# Patient Record
Sex: Female | Born: 1975 | Race: White | Hispanic: No | Smoking: Never smoker
Health system: Southern US, Community
[De-identification: ages and names within clinical notes are randomized; demographics above are authoritative.]

## PROBLEM LIST (undated history)

## (undated) DIAGNOSIS — F419 Anxiety disorder, unspecified: Secondary | ICD-10-CM

## (undated) DIAGNOSIS — F329 Major depressive disorder, single episode, unspecified: Secondary | ICD-10-CM

## (undated) DIAGNOSIS — E079 Disorder of thyroid, unspecified: Secondary | ICD-10-CM

## (undated) DIAGNOSIS — F32A Depression, unspecified: Secondary | ICD-10-CM

## (undated) HISTORY — DX: Depression, unspecified: F32.A

## (undated) HISTORY — DX: Major depressive disorder, single episode, unspecified: F32.9

## (undated) HISTORY — DX: Disorder of thyroid, unspecified: E07.9

## (undated) HISTORY — DX: Anxiety disorder, unspecified: F41.9

---

## 2004-01-31 ENCOUNTER — Other Ambulatory Visit: Admission: RE | Admit: 2004-01-31 | Discharge: 2004-01-31 | Payer: Self-pay | Admitting: Obstetrics and Gynecology

## 2005-01-25 ENCOUNTER — Other Ambulatory Visit: Admission: RE | Admit: 2005-01-25 | Discharge: 2005-01-25 | Payer: Self-pay | Admitting: Obstetrics and Gynecology

## 2005-01-26 ENCOUNTER — Other Ambulatory Visit: Admission: RE | Admit: 2005-01-26 | Discharge: 2005-01-26 | Payer: Self-pay | Admitting: Obstetrics and Gynecology

## 2005-09-14 ENCOUNTER — Other Ambulatory Visit: Admission: RE | Admit: 2005-09-14 | Discharge: 2005-09-14 | Payer: Self-pay | Admitting: Obstetrics and Gynecology

## 2006-02-25 ENCOUNTER — Other Ambulatory Visit: Admission: RE | Admit: 2006-02-25 | Discharge: 2006-02-25 | Payer: Self-pay | Admitting: Obstetrics and Gynecology

## 2006-03-16 ENCOUNTER — Encounter: Admission: RE | Admit: 2006-03-16 | Discharge: 2006-03-16 | Payer: Self-pay | Admitting: Obstetrics and Gynecology

## 2007-07-26 ENCOUNTER — Encounter: Admission: RE | Admit: 2007-07-26 | Discharge: 2007-07-26 | Payer: Self-pay | Admitting: Obstetrics and Gynecology

## 2009-04-16 ENCOUNTER — Encounter: Admission: RE | Admit: 2009-04-16 | Discharge: 2009-04-16 | Payer: Self-pay | Admitting: Obstetrics and Gynecology

## 2010-04-30 ENCOUNTER — Encounter: Admission: RE | Admit: 2010-04-30 | Discharge: 2010-04-30 | Payer: Self-pay | Admitting: Obstetrics and Gynecology

## 2011-09-10 ENCOUNTER — Other Ambulatory Visit: Payer: Self-pay | Admitting: Obstetrics and Gynecology

## 2011-09-10 DIAGNOSIS — Z1231 Encounter for screening mammogram for malignant neoplasm of breast: Secondary | ICD-10-CM

## 2011-10-07 ENCOUNTER — Ambulatory Visit
Admission: RE | Admit: 2011-10-07 | Discharge: 2011-10-07 | Disposition: A | Discharge status: Home / Self Care | Payer: BC Managed Care – PPO | Source: Ambulatory Visit | Attending: Obstetrics and Gynecology | Admitting: Obstetrics and Gynecology

## 2011-10-07 DIAGNOSIS — Z1231 Encounter for screening mammogram for malignant neoplasm of breast: Secondary | ICD-10-CM

## 2013-06-07 ENCOUNTER — Ambulatory Visit (INDEPENDENT_AMBULATORY_CARE_PROVIDER_SITE_OTHER): Payer: BC Managed Care – PPO | Admitting: Family Medicine

## 2013-06-07 ENCOUNTER — Encounter: Payer: Self-pay | Admitting: Family Medicine

## 2013-06-07 VITALS — BP 112/82 | Temp 98.3°F | Ht 63.5 in | Wt 122.0 lb

## 2013-06-07 DIAGNOSIS — Z7189 Other specified counseling: Secondary | ICD-10-CM

## 2013-06-07 DIAGNOSIS — Z7689 Persons encountering health services in other specified circumstances: Secondary | ICD-10-CM

## 2013-06-07 DIAGNOSIS — F988 Other specified behavioral and emotional disorders with onset usually occurring in childhood and adolescence: Secondary | ICD-10-CM

## 2013-06-07 DIAGNOSIS — F411 Generalized anxiety disorder: Secondary | ICD-10-CM

## 2013-06-07 DIAGNOSIS — F4321 Adjustment disorder with depressed mood: Secondary | ICD-10-CM

## 2013-06-07 DIAGNOSIS — E039 Hypothyroidism, unspecified: Secondary | ICD-10-CM

## 2013-06-07 DIAGNOSIS — F329 Major depressive disorder, single episode, unspecified: Secondary | ICD-10-CM

## 2013-06-07 LAB — BASIC METABOLIC PANEL
BUN: 13 mg/dL (ref 6–23)
Glucose, Bld: 87 mg/dL (ref 70–99)
Potassium: 3.5 mEq/L (ref 3.5–5.1)
Sodium: 137 mEq/L (ref 135–145)

## 2013-06-07 LAB — HEMOGLOBIN A1C: Hgb A1c MFr Bld: 4.7 % (ref 4.6–6.5)

## 2013-06-07 LAB — LIPID PANEL: Total CHOL/HDL Ratio: 2

## 2013-06-07 NOTE — Patient Instructions (Addendum)
-  We have ordered labs or studies at this visit. It can take up to 1-2 weeks for results and processing. We will contact you with instructions IF your results are abnormal. Normal results will be released to your [facility]. If you have not heard from [place] or can not find your results in [facility] in 2 weeks please contact our office.  -PLEASE SIGN UP FOR MYCHART TODAY   We recommend the following healthy lifestyle measures: - eat a healthy diet consisting of lots of vegetables, fruits, beans, nuts, seeds, healthy meats such as white chicken and fish and whole grains.  - avoid fried foods, fast food, processed foods, sodas, red meet and other fattening foods.  - get a least 150 minutes of aerobic exercise per week.   Schedule appointment with psychiatrist for management of depression, anxiety and ADD. I am happy to refill you medications for the next 2 months if needed before seeing psychiatrist.  Follow up in: as needed

## 2013-06-07 NOTE — Progress Notes (Signed)
Chief Complaint  Patient presents with  . Establish Care  . Depression    husband recently passed away     HPI:  Renee Proctor is here to establish care. She recently lost her husband in marathon race a few months ago. Last PCP and physical: goes to [facility] - had physical yesterday. Has physicals yearly, has chronic high hemoglobin chronically.   Has the following chronic problems and concerns today:  Grief/Depression/Anxiety: -seeing a chaplain/counselor for grief counseling -has a history of depression and on Renee Proctor in the past and many different medications for depression in the past for as long as she can remember -taking Renee Proctor on and off for anxiety -has had worsening depression since death of spouse, difficulty sleeping -put on Renee Proctor a month ago, but stopped recently -she is interested other medications for depression -strong FH psych issues - father hospitalized, brother hospitalized and got ECT  Adult ADD: -reports Dr. Sharl Ma at [facility] diagnosed her with ADD a year and a half ago -put on Renee Proctor 1.5 years ago  Hypothyroid: -Had blood panel in 2013 - borderline hypothyroidism, she had mild hypothyroidism in the past and was on synthroid in the past, but when moved back to [facility] in 2004 this was stopped by PCP at the time.  Has some issues with dry skin and a little hoarseness in voice chronically -denies constipation, weight issues, palpitaitons, vision changes    There are no active problems to display for this patient.   Health Maintenance:  ROS: See pertinent positives and negatives per HPI.  Past Medical History  Diagnosis Date  . Depression   . Anxiety   . Thyroid disease     Family History  Problem Relation Age of Onset  . Heart disease Mother 35  . Hypertension Mother   . Hyperlipidemia Mother   . Mental illness Father   . Mental illness Sister   . Diabetes Maternal Aunt   . Diabetes Maternal Uncle   . Mental illness Paternal  Uncle   . Heart disease Maternal Grandmother   . Diabetes Maternal Grandmother   . Heart disease Maternal Grandfather   . Diabetes Maternal Grandfather   . Mental illness Paternal Grandfather     History   Social History  . Marital Status: Widowed    Spouse Name: N/A    Number of Children: N/A  . Years of Education: N/A   Social History Main Topics  . Smoking status: Never Smoker   . Smokeless tobacco: None  . Alcohol Use: Yes     Comment: occ, 2-3 maybe 1-2 x per month  . Drug Use: No  . Sexually Active: None   Other Topics Concern  . 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  GENERAL: vitals reviewed and listed above, alert, oriented, appears well hydrated and in no acute distress  HEENT: atraumatic, conjunttiva clear, no obvious abnormalities on inspection of external nose and ears  NECK: no obvious masses on inspection  LUNGS: clear to auscultation bilaterally, no wheezes, rales or rhonchi, good air movement  CV: HRRR, no peripheral edema  MS: moves all extremities without noticeable abnormality  PSYCH: pleasant and cooperative, no obvious depression or anxiety  ASSESSMENT AND PLAN:  Discussed the following assessment and plan:  Depression -complex psych hx and psych picture -no SI  -will have her see psychiatry for medication management - numbers provided for her to call and she reports she has plenty of he medications to last to appt with  psych -she will continue with current counselor  Generalized anxiety disorder  Grief reaction -supported and counseled, has good support and counseling  Encounter to establish care - Plan: Lipid Panel, Hemoglobin A1c, Basic metabolic panel, CBC with Differential  ADD (attention deficit disorder)  Hypothyroid - Plan: TSH, T4, Free, T3 -will check thyroid labs given confusion regarding possible hypothyroidism/tx in the past -if borderline she would like to see endocrine to help decide if tx indicated  -We reviewed the PMH, PSH, FH, SH, Meds and Allergies. -We provided refills for any medications we will prescribe as needed. -We addressed current concerns per orders and patient instructions. -We have asked for records for pertinent exams, studies, vaccines and notes from previous providers. -We have advised patient to follow up per instructions below. -offered tdap, she will hold off on this for now   -Patient advised to return or notify a doctor immediately if symptoms worsen or persist or new concerns arise.  Patient Instructions  -We have ordered labs or studies at this visit. It can take up to 1-2 weeks for results and processing. We will contact you with instructions IF your results are abnormal. Normal results will be released to your [facility]. If you have not heard from [place] or can not find your results in [facility] in 2 weeks please contact our office.  -PLEASE SIGN UP FOR MYCHART TODAY   We recommend the following healthy lifestyle measures: - eat a healthy diet consisting of lots of vegetables, fruits, beans, nuts, seeds, healthy meats such as white chicken and fish and whole grains.  - avoid fried foods, fast food, processed foods, sodas, red meet and other fattening foods.  - get a least 150 minutes of aerobic exercise per week.   Schedule appointment with psychiatrist for management of depression, anxiety and ADD. I am happy to refill you medications for the next 2 months if  needed before seeing psychiatrist.  Follow up in: as needed       Kriste Basque R.

## 2013-06-08 LAB — CBC WITH DIFFERENTIAL/PLATELET
Basophils Absolute: 0.1 10*3/uL (ref 0.0–0.1)
Basophils Relative: 0.6 % (ref 0.0–3.0)
Eosinophils Absolute: 0 10*3/uL (ref 0.0–0.7)
Eosinophils Relative: 0.4 % (ref 0.0–5.0)
HCT: 43.1 % (ref 36.0–46.0)
Hemoglobin: 14.6 g/dL (ref 12.0–15.0)
Lymphocytes Relative: 21.9 % (ref 12.0–46.0)
Lymphs Abs: 2 10*3/uL (ref 0.7–4.0)
MCHC: 33.9 g/dL (ref 30.0–36.0)
MCV: 98.4 fl (ref 78.0–100.0)
Monocytes Absolute: 0.3 10*3/uL (ref 0.1–1.0)
Monocytes Relative: 3.3 % (ref 3.0–12.0)
Neutro Abs: 6.8 10*3/uL (ref 1.4–7.7)
Neutrophils Relative %: 73.8 % (ref 43.0–77.0)
Platelets: 208 10*3/uL (ref 150.0–400.0)
RBC: 4.38 Mil/uL (ref 3.87–5.11)
RDW: 13.9 % (ref 11.5–14.6)
WBC: 9.2 10*3/uL (ref 4.5–10.5)

## 2013-06-12 ENCOUNTER — Telehealth: Payer: Self-pay

## 2013-06-12 ENCOUNTER — Encounter: Payer: Self-pay | Admitting: Family Medicine

## 2013-06-12 NOTE — Progress Notes (Signed)
Labs from 2013 and a few OV notes received from prior PCP at [facility]. Thyroid labs normal at the time.

## 2013-06-12 NOTE — Telephone Encounter (Signed)
Per Dr. Selena Batten called and spoke with pt and pt is aware lab work was normal.  Labs faxed over from [facility].

## 2015-06-24 ENCOUNTER — Emergency Department (HOSPITAL_COMMUNITY)
Admission: EM | Admit: 2015-06-24 | Discharge: 2015-06-25 | Disposition: A | Discharge status: Home / Self Care | Payer: BLUE CROSS/BLUE SHIELD | Attending: Emergency Medicine | Admitting: Emergency Medicine

## 2015-06-24 DIAGNOSIS — T50904A Poisoning by unspecified drugs, medicaments and biological substances, undetermined, initial encounter: Secondary | ICD-10-CM

## 2015-06-24 DIAGNOSIS — Y939 Activity, unspecified: Secondary | ICD-10-CM | POA: Diagnosis not present

## 2015-06-24 DIAGNOSIS — F1010 Alcohol abuse, uncomplicated: Secondary | ICD-10-CM | POA: Insufficient documentation

## 2015-06-24 DIAGNOSIS — E079 Disorder of thyroid, unspecified: Secondary | ICD-10-CM | POA: Diagnosis not present

## 2015-06-24 DIAGNOSIS — Z3202 Encounter for pregnancy test, result negative: Secondary | ICD-10-CM | POA: Insufficient documentation

## 2015-06-24 DIAGNOSIS — F1394 Sedative, hypnotic or anxiolytic use, unspecified with sedative, hypnotic or anxiolytic-induced mood disorder: Secondary | ICD-10-CM | POA: Diagnosis not present

## 2015-06-24 DIAGNOSIS — Z880 Allergy status to penicillin: Secondary | ICD-10-CM | POA: Insufficient documentation

## 2015-06-24 DIAGNOSIS — Y999 Unspecified external cause status: Secondary | ICD-10-CM | POA: Insufficient documentation

## 2015-06-24 DIAGNOSIS — Y929 Unspecified place or not applicable: Secondary | ICD-10-CM | POA: Diagnosis not present

## 2015-06-24 DIAGNOSIS — T424X4A Poisoning by benzodiazepines, undetermined, initial encounter: Secondary | ICD-10-CM | POA: Diagnosis present

## 2015-06-24 DIAGNOSIS — Z79899 Other long term (current) drug therapy: Secondary | ICD-10-CM | POA: Insufficient documentation

## 2015-06-24 DIAGNOSIS — Z634 Disappearance and death of family member: Secondary | ICD-10-CM | POA: Insufficient documentation

## 2015-06-24 DIAGNOSIS — F1994 Other psychoactive substance use, unspecified with psychoactive substance-induced mood disorder: Secondary | ICD-10-CM

## 2015-06-24 DIAGNOSIS — F419 Anxiety disorder, unspecified: Secondary | ICD-10-CM | POA: Diagnosis not present

## 2015-06-24 DIAGNOSIS — F329 Major depressive disorder, single episode, unspecified: Secondary | ICD-10-CM | POA: Insufficient documentation

## 2015-06-24 LAB — CBC WITH DIFFERENTIAL/PLATELET
BASOS ABS: 0 10*3/uL (ref 0.0–0.1)
Basophils Relative: 0 % (ref 0–1)
EOS PCT: 1 % (ref 0–5)
Eosinophils Absolute: 0.1 10*3/uL (ref 0.0–0.7)
HCT: 43 % (ref 36.0–46.0)
Hemoglobin: 14.9 g/dL (ref 12.0–15.0)
LYMPHS PCT: 28 % (ref 12–46)
Lymphs Abs: 2 10*3/uL (ref 0.7–4.0)
MCH: 33.2 pg (ref 26.0–34.0)
MCHC: 34.7 g/dL (ref 30.0–36.0)
MCV: 95.8 fL (ref 78.0–100.0)
Monocytes Absolute: 0.3 10*3/uL (ref 0.1–1.0)
Monocytes Relative: 4 % (ref 3–12)
Neutro Abs: 4.7 10*3/uL (ref 1.7–7.7)
Neutrophils Relative %: 67 % (ref 43–77)
PLATELETS: 222 10*3/uL (ref 150–400)
RBC: 4.49 MIL/uL (ref 3.87–5.11)
RDW: 13 % (ref 11.5–15.5)
WBC: 7 10*3/uL (ref 4.0–10.5)

## 2015-06-24 LAB — RAPID URINE DRUG SCREEN, HOSP PERFORMED
Amphetamines: NOT DETECTED
BARBITURATES: NOT DETECTED
BENZODIAZEPINES: POSITIVE — AB
COCAINE: NOT DETECTED
Opiates: NOT DETECTED
TETRAHYDROCANNABINOL: NOT DETECTED

## 2015-06-24 LAB — CBG MONITORING, ED: GLUCOSE-CAPILLARY: 61 mg/dL — AB (ref 65–99)

## 2015-06-24 LAB — POC URINE PREG, ED: PREG TEST UR: NEGATIVE

## 2015-06-24 MED ORDER — SODIUM CHLORIDE 0.9 % IV BOLUS (SEPSIS): 1000.0000 mL | Freq: Once | INTRAVENOUS | Status: DC

## 2015-06-24 NOTE — ED Notes (Signed)
Spoke with Alona BeneJoyce, Poison Control-states to monitor for: -CNS depression -Respiratory depression -routine labs Observe for 6 hours

## 2015-06-24 NOTE — ED Provider Notes (Addendum)
TIME SEEN: 11:20 PM  CHIEF COMPLAINT: Overdose  HPI: Pt is a 39 y.o. female with history of depression, anxiety who presents to the emergency department after an overdose. Reports that she was upset tonight and drinking 2 beers and 2 vodka drinks. Afterward she took around eight 1 mg Xanax tablets between 8 PM and 11 PM because she wanted to get asleep and "stop hurting so bad". She is unable to tell me if this was a suicide attempt. Denies previous suicide attempt. Denies any other coingestions. She is on Wellbutrin as well as amiodarone home.  ROS: See HPI Constitutional: no fever  Eyes: no drainage  ENT: no runny nose   Cardiovascular:  no chest pain  Resp: no SOB  GI: no vomiting GU: no dysuria Integumentary: no rash  Allergy: no hives  Musculoskeletal: no leg swelling  Neurological: no slurred speech ROS otherwise negative  PAST MEDICAL HISTORY/PAST SURGICAL HISTORY:  Past Medical History  Diagnosis Date  . Depression   . Anxiety   . Thyroid disease     MEDICATIONS:  Prior to Admission medications   Medication Sig Start Date End Date Taking? Authorizing Provider  ALPRAZolam Prudy Feeler) 0.5 MG tablet Take 0.5 mg by mouth 2 (two) times daily.    Historical Provider, MD  amphetamine-dextroamphetamine (ADDERALL) 20 MG tablet Take 20 mg by mouth daily.    Historical Provider, MD    ALLERGIES:  Allergies  Allergen Reactions  . Amoxicillin Rash  . Sulfa Antibiotics Rash    SOCIAL HISTORY:  History  Substance Use Topics  . Smoking status: Never Smoker   . Smokeless tobacco: Not on file  . Alcohol Use: Yes     Comment: occ, 2-3 maybe 1-2 x per month    FAMILY HISTORY: Family History  Problem Relation Age of Onset  . Heart disease Mother 3  . Hypertension Mother   . Hyperlipidemia Mother   . Mental illness Father   . Mental illness Sister   . Diabetes Maternal Aunt   . Diabetes Maternal Uncle   . Mental illness Paternal Uncle   . Heart disease Maternal  Grandmother   . Diabetes Maternal Grandmother   . Heart disease Maternal Grandfather   . Diabetes Maternal Grandfather   . Mental illness Paternal Grandfather     EXAM: BP 140/92 mmHg  Pulse 90  Temp(Src) 97.8 F (36.6 C) (Oral)  Resp 13  Ht  (1.6 m)  Wt 123 lb (55.792 kg)  BMI 21.79 kg/m2  SpO2 100% CONSTITUTIONAL: Alert and oriented and responds appropriately to questions.  Tearful, appears intoxicated, slightly slurred speech and smells of alcohol, protecting airway, hemodynamically stable, awake HEAD: Normocephalic EYES: Conjunctivae clear, PERRL ENT: normal nose; no rhinorrhea; slightly dry mucous membranes; pharynx without lesions noted NECK: Supple, no meningismus, no LAD  CARD: Regular and minimally tachycardic; S1 and S2 appreciated; no murmurs, no clicks, no rubs, no gallops RESP: Normal chest excursion without splinting or tachypnea; breath sounds clear and equal bilaterally; no wheezes, no rhonchi, no rales, no hypoxia or respiratory distress, speaking full sentences ABD/GI: Normal bowel sounds; non-distended; soft, non-tender, no rebound, no guarding, no peritoneal signs BACK:  The back appears normal and is non-tender to palpation, there is no CVA tenderness EXT: Normal ROM in all joints; non-tender to palpation; no edema; normal capillary refill; no cyanosis, no calf tenderness or swelling    SKIN: Normal color for age and race; warm NEURO: Moves all extremities equally, sensation to light touch intact diffusely,  cranial nerves II through XII intact PSYCH: Tearful. Patient will not answer me if she is having suicidal thoughts. Denies HI, hallucinations.  MEDICAL DECISION MAKING: Patient here after intentional overdose. She states she took the Xanax over the course of 3 hours starting at 8 PM tonight. Also has drank alcohol. No other coingestion.  We'll continue to monitor patient. Will obtain labs, urine and consult TTS. EKG shows no interval changes,  arrhythmia.  ED PROGRESS: 12:45 AM  Pt still awake, alert. Labs unremarkable other than ethanol level of 121, urine drug screen is positive for benzodiazepine's. She has been evaluated by TTS and they recommend reevaluation by psychiatry in the morning. She is here voluntarily at this time.   5:00 AM  Pt has been monitored for 6 hours after ingestion and is hemodynamically stable. Awaiting psychiatric reevaluation this morning.   EKG Interpretation  Date/Time:  Tuesday June 24 2015 23:17:22 EDT Ventricular Rate:  91 PR Interval:  129 QRS Duration: 78 QT Interval:  369 QTC Calculation: 454 R Axis:   74 Text Interpretation:  Sinus rhythm Borderline low voltage, extremity leads No significant change since last tracing Confirmed by WARD,  DO, KRISTEN [phone]) on 06/24/2015 11:19:50 PM        Kristen N Ward, DO 06/25/15 0443  6:30 AM  Pt able to ambulate without difficulty. Tolerating fluids. Awaiting psychiatric disposition. Here voluntarily.  Layla MawKristen N Ward, DO 06/25/15 [phone]

## 2015-06-24 NOTE — ED Notes (Signed)
TTS at bedside. 

## 2015-06-24 NOTE — ED Notes (Signed)
Patient arrives by EMS with overdose xanax.  Per EMS, patient was at the bar, was upset, hx depression, recent death of spouse, per [employer], they were called out by friends because the patient would not answer her phone or door.  Friends forced entry and found patient "groggy" in her bedroom.  Patient took unknown amount of xanax 1 mg tabs-had 2 bottles of xanax 1 mg-90 tabs per each bottle, bottles filled 05/12/2015, and June 24th.  One bottle is empty, another bottle has 121 tablets, 1 mg and 9 1/2 tabs.  Patient also drank 4-5 mixed drinks.

## 2015-06-24 NOTE — ED Notes (Signed)
Bed: RESB Expected date:  Expected time:  Means of arrival:  Comments: EMS overdose xanax

## 2015-06-24 NOTE — BH Assessment (Signed)
Reviewed ED notes prior to initiating assessment. Per notes pt took unknown amount of Xanax with 4-5 alcoholic drinks. Pt has hx of depression and her spouse recently passed away. Assessment to commence shortly if pt is able to participate.    Clista BernhardtNancy Hebah Bogosian, [facility] Triage Specialist 06/24/2015 11:26 PM

## 2015-06-25 DIAGNOSIS — F1010 Alcohol abuse, uncomplicated: Secondary | ICD-10-CM | POA: Diagnosis present

## 2015-06-25 DIAGNOSIS — Z634 Disappearance and death of family member: Secondary | ICD-10-CM

## 2015-06-25 DIAGNOSIS — F1994 Other psychoactive substance use, unspecified with psychoactive substance-induced mood disorder: Secondary | ICD-10-CM | POA: Diagnosis present

## 2015-06-25 LAB — COMPREHENSIVE METABOLIC PANEL
ALT: 24 U/L (ref 14–54)
AST: 36 U/L (ref 15–41)
Albumin: 3.9 g/dL (ref 3.5–5.0)
Alkaline Phosphatase: 37 U/L — ABNORMAL LOW (ref 38–126)
Anion gap: 9 (ref 5–15)
BILIRUBIN TOTAL: 0.8 mg/dL (ref 0.3–1.2)
BUN: 10 mg/dL (ref 6–20)
CALCIUM: 9.1 mg/dL (ref 8.9–10.3)
CO2: 23 mmol/L (ref 22–32)
Chloride: 107 mmol/L (ref 101–111)
Creatinine, Ser: 0.79 mg/dL (ref 0.44–1.00)
GFR calc Af Amer: >60 mL/min (ref >60)
GLUCOSE: 87 mg/dL (ref 65–99)
Potassium: 4 mmol/L (ref 3.5–5.1)
SODIUM: 139 mmol/L (ref 135–145)
TOTAL PROTEIN: 7.4 g/dL (ref 6.5–8.1)

## 2015-06-25 LAB — URINALYSIS, ROUTINE W REFLEX MICROSCOPIC
BILIRUBIN URINE: NEGATIVE
GLUCOSE, UA: NEGATIVE mg/dL
KETONES UR: NEGATIVE mg/dL
Nitrite: NEGATIVE
Protein, ur: NEGATIVE mg/dL
Specific Gravity, Urine: 1.003 — ABNORMAL LOW (ref 1.005–1.030)
Urobilinogen, UA: 0.2 mg/dL (ref 0.0–1.0)
pH: 6 (ref 5.0–8.0)

## 2015-06-25 LAB — ACETAMINOPHEN LEVEL

## 2015-06-25 LAB — URINE MICROSCOPIC-ADD ON

## 2015-06-25 LAB — SALICYLATE LEVEL: Salicylate Lvl: <4 mg/dL (ref 2.8–30.0)

## 2015-06-25 LAB — ETHANOL: ALCOHOL ETHYL (B): 121 mg/dL — AB (ref <5)

## 2015-06-25 MED ORDER — FLUTICASONE PROPIONATE 50 MCG/ACT NA SUSP
1.0000 | Freq: Every day | NASAL | Status: DC
  Administered 2015-06-25: 1 via NASAL
  Filled 2015-06-25: qty 16

## 2015-06-25 MED ORDER — BUPROPION HCL ER (XL) 150 MG PO TB24
150.0000 mg | ORAL_TABLET | Freq: Two times a day (BID) | ORAL | Status: DC
  Administered 2015-06-25: 150 mg via ORAL
  Filled 2015-06-25 (×2): qty 1

## 2015-06-25 MED ORDER — GLYCOPYRROLATE 1 MG PO TABS
2.0000 mg | ORAL_TABLET | Freq: Three times a day (TID) | ORAL | Status: DC
Start: 2015-06-25 — End: 2015-06-25
  Administered 2015-06-25: 2 mg via ORAL
  Filled 2015-06-25 (×3): qty 2

## 2015-06-25 MED ORDER — LORATADINE 10 MG PO TABS
10.0000 mg | ORAL_TABLET | Freq: Every day | ORAL | Status: DC
  Administered 2015-06-25: 10 mg via ORAL

## 2015-06-25 MED ORDER — PANTOPRAZOLE SODIUM 40 MG PO TBEC
40.0000 mg | DELAYED_RELEASE_TABLET | Freq: Every day | ORAL | Status: DC
  Administered 2015-06-25: 40 mg via ORAL
  Filled 2015-06-25: qty 1

## 2015-06-25 MED ORDER — LEVONORGEST-ETH ESTRAD 91-DAY 0.15-0.03 MG PO TABS: 1.0000 | ORAL_TABLET | Freq: Every day | ORAL | Status: DC

## 2015-06-25 NOTE — ED Notes (Signed)
I arrived to find her resting comfortably in the presence of a sitter.  Her skin is normal, warm and dry and she is breathing normally and in no distress.  I have given report to BerkeyJacqueline, [place] [facility] and [place]. Is accompanied there now by our sitter and security without incident. She ambulates without difficulty.

## 2015-06-25 NOTE — ED Notes (Signed)
Pt ambulated to bathroom with steady gait. She apologized for her behavior earlier, stating" I'm very sorry, that's not me, I'm really a nice person" She denies SI at this time, sts "I didn't even had that much, just a few drinks, it was bad idea to mix alcohol and medications."

## 2015-06-25 NOTE — ED Notes (Signed)
IVC papers served. 

## 2015-06-25 NOTE — ED Notes (Signed)
Pt again attempting to leave her room without any clothes on. Pt redirected without much success. Pt very tearful, worried about her dog, stating we "might as well kill me". Attempted to sooth pt. Boundaries set. Sitter remains at bedside.

## 2015-06-25 NOTE — Discharge Instructions (Addendum)
For your ongoing behavioral health needs you are advised to follow up with an outpatient therapist.  Contact the following practices at your earliest opportunity to schedule an intake appointment:        [facility]      [address]      [place] [address]      [phone]       [facility]      [address]      [place], [place] [address]      [phone]       [facility]      [phone]      [place], [place] [address]      [phone]

## 2015-06-25 NOTE — ED Notes (Signed)
Called into room by sitter, pt had removed her gown and was attempting to walk out to the nurses station to "call her dog". Assisted pt back into bed by sitter and RN, cardiac monitor placed back on pt, and policy which states she can make calls starting at 9 in the morning.

## 2015-06-25 NOTE — BHH Suicide Risk Assessment (Signed)
Suicide Risk Assessment  Discharge Assessment   [facility]   Demographic Factors:  Female and Caucasian, lives alone  Total Time spent with patient: 45 minutes   Musculoskeletal: Strength & Muscle Tone: within normal limits Gait & Station: normal Patient leans: N/A  Psychiatric Specialty Exam: Physical Exam  Review of Systems  Constitutional: Negative.   HENT: Negative.   Eyes: Negative.   Respiratory: Negative.   Cardiovascular: Negative.   Gastrointestinal: Negative.   Genitourinary: Negative.   Musculoskeletal: Negative.   Skin: Negative.   Neurological: Negative.   Endo/Heme/Allergies: Negative.   Psychiatric/Behavioral: Positive for depression and substance abuse. The patient is nervous/anxious.     Blood pressure 130/85, pulse 77, temperature 98.6 F (37 C), temperature source Oral, resp. rate 19, height 5\' 3"  (1.6 m), weight 55.792 kg (123 lb), SpO2 100 %.Body mass index is 21.79 kg/(m^2).  General Appearance: Casual  Eye Contact::  Good  Speech:  Normal Rate  Volume:  Normal  Mood:  Depressed mild  Affect:  Congruent  Thought Process:  Coherent  Orientation:  Full (Time, Place, and Person)  Thought Content:  WDL  Suicidal Thoughts:  No  Homicidal Thoughts:  No  Memory:  Immediate;   Good Recent;   Fair Remote;   Good  Judgement:  Fair  Insight:  Good  Psychomotor Activity:  Normal  Concentration:  Good  Recall:  Good  Fund of Knowledge:Good  Language: Good  Akathisia:  No  Handed:  Right  AIMS (if indicated):     Assets:  Communication Skills Desire for Improvement Financial Resources/Insurance Housing Leisure Time Physical Health Resilience Social Support Talents/Skills Transportation Vocational/Educational  ADL's:  Intact  Cognition: WNL  Sleep:         Has this patient used any form of tobacco in the last 30 days? (Cigarettes, Smokeless Tobacco, Cigars, and/or Pipes) No  Mental Status Per Nursing  Assessment::   On Admission:   Alcohol abuse  Current Mental Status by Physician: NA  Loss Factors: NA  Historical Factors: NA  Risk Reduction Factors:   Sense of responsibility to family, Employed, Positive social support and Positive coping skills or problem solving skills  Continued Clinical Symptoms:  Depression, mild  Cognitive Features That Contribute To Risk:  None    Suicide Risk:  Minimal: No identifiable suicidal ideation.  Patients presenting with no risk factors but with morbid ruminations; may be classified as minimal risk based on the severity of the depressive symptoms  Principal Problem: Substance induced mood disorder Discharge Diagnoses:  Patient Active Problem List   Diagnosis Date Noted  . Alcohol abuse [F10.10] 06/25/2015    Priority: High  . Substance induced mood disorder [F19.94] 06/25/2015    Priority: High  . Bereavement [Z63.4] 06/25/2015    Priority: High      Plan Of Care/Follow-up recommendations:  Activity:  as tolerated Diet:  heart healthy diet  Is patient on multiple antipsychotic therapies at discharge:  No   Has Patient had three or more failed trials of antipsychotic monotherapy by history:  No  Recommended Plan for Multiple Antipsychotic Therapies: NA    Renee Proctor, PMH-NP 06/25/2015, 1:18 PM

## 2015-06-25 NOTE — ED Notes (Signed)
Per pt's request this RN called pt's mom to inform her pt is in the ED and wants mom to take care of her dog. [place] [facility] [place]. Pt updated on phone conversation with her mom.

## 2015-06-25 NOTE — ED Notes (Signed)
Due to patient incessant requests, Mother was contacted to make sure that she had the dog, that the dog was okay and that she had the dog's medications. Patient also requested that mother contact her work, number was given to mother.

## 2015-06-25 NOTE — ED Notes (Signed)
Pt is trying to get out of bed, pulling off monitor and cursing at staff. Pt stood up with very unsteady gait and had to be helped back to bed. She is very uncooperative, she took her gown off and is crying "You are fucking bitch, you need to let me go to my dog. My dog is all I have and if he dies I promise I will blow my brains out". Pt assured we will check on her dog.

## 2015-06-25 NOTE — BH Assessment (Addendum)
Tele Assessment Note BAL pending at time of assessment   Renee Proctor is an 39 y.o. 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 Police were called to pt's home at the request of concerned friends, who reported pt had become upset at a bar, and then would not answer her phone or door. Police had to force entry and found pt "groggy" in her room. Pt gave varying answers about how much alcohol and xanax she had taken tonight, but significant overdose was suspected and poison control recommended 6 hours of monitoring. Police report pt has at least 10 prescription bottles at there home, and brought two Xanax bottles with them. Pt has prescriptions for 1mg  Xanax up to three times per day as needed, and had two bottles that had 90 pills when filled. Rn counted 121 pills remaining from prescriptions from May and June.   At time of assessment pt was upset. She reported she needed to get her dog from a friend's house and wanted her parents to take care of her dog. She reported her friend took the dog to his house, and she does not feel he cares for his own pets, so she worried about her dog. She stated, "If my dog dies I will blow my brains out." Pt was oriented to person, and place. She reported the date was July 25 or 26, 2016. She reported, "I was drinking at a bar, as my friends do, and became obnoxious, morose and sullen, and my friends called the cops." She reported that she did not feel she was acting any differently than her friends do. She reports she hangs out at predominantly gay bars and whichever girl is being dramatic, tends to "freak them (her friend) out." Pt had slurred but coherent speech, depressed and anxious mood with appropriate affect. She reports she lost her husband unexpectedly 2 years ago, when he died of heart failure while running a marathon. Pt reports she has flashbacks of seeing him in his casket, and feels very guilty, because she had started taking adderall and mentioned to him how  well it was working for her, and then he got his own prescription. She wonders if this contributed to his death. Pt reports she has worked at the same place for 13 years and is fearful that she will be fired if she does not show up for work, and they find out why she was in ED. Pt denies AVH, HI, and self harm. She reports she drinks weekends 3-4 drinks, reported 4-5 to other ED staff. She denies use of drugs, reports she takes prescribed medications as prescribed except she takes half of the prescribed adderall as she reports it keeps her up at night. Pt told this Clinical research associate she took half a xanax tonight, however, told EDP she took 8, 1 mg Xanax so she could "stop hurting" and would not confirm if this was a suicide attempt. Pt denies past or present suicide attempts to this Clinical research associate. She reports she has SI at times but "I am too much of a coward to do it." She reports she has to stay alive for her family and her dog. Pt denies current suicidal planning but reports in the past she has thought about overdosing, shooting herself, or gassing herself in her garage. She reports she is tired of "this scene, this atmosphere," but denies suicidal intent.   Pt reports she has struggled with depression since age 21, but notes her current sx are the worst they have ever been.  She reports loss of pleasure, loss of motivation, crying spells, irritability, feeling sad about her friends, and just things in the world, SI and at times SI with planning. She denies current intent to harm herself or others. Denies hx of mania or hypomania.   Pt reports she has struggled with anxiety much of her life and began to have panic attacks after she lost her husband. She reports before he died she would worry when he traveled, and imagine his plane had crashed if she did not hear from him right away. Pt denies hx of abuse or neglect.   Pt reports family hx is positive for alzheimer's (dad) and reports her dad had "a nervous breakdown once." No  suicidal or SA hx noted.   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 Depression   . Anxiety   . Thyroid disease     No past surgical history on file.  Family History:  Family History  Problem Relation Age of Onset  . Heart disease Mother 68  . Hypertension Mother   . Hyperlipidemia Mother   . Mental illness Father   . Mental illness Sister   . Diabetes Maternal Aunt   . Diabetes Maternal Uncle   . Mental illness Paternal Uncle   . Heart disease Maternal Grandmother   . Diabetes Maternal Grandmother   . Heart disease Maternal Grandfather   . Diabetes Maternal Grandfather   . Mental illness Paternal Grandfather     Social History:  reports that she has never smoked. She does not have any smokeless tobacco history on file. She reports that she drinks alcohol. She reports that she does not use illicit drugs.  Additional Social History:  Alcohol / Drug Use Pain Medications: See PTA Prescriptions: SEE PTA Over the Counter: See PTA History of alcohol / drug use?: Yes Longest period of sobriety (when/how long): days to weeks, no hx of seizures reported  Negative Consequences of Use:  (denies) Withdrawal Symptoms:  (denies) Substance #1 Name of Substance 1: etoh  1 - Age of First Use: 17 1 - Amount (size/oz): 3-4 but reproted 4-5 drinks to RN 1 - Frequency: on weekends 1 - Duration: years 1 - Last Use / Amount: 06-24-15 3-4 drinks  CIWA: CIWA-Ar BP: 145/94 mmHg Pulse Rate: 103 COWS:    PATIENT STRENGTHS: (choose at least two) Ability for insight Average or above average intelligence Communication skills Work skills  Allergies:  Allergies  Allergen Reactions  . Amoxicillin Rash  . Sulfa  Antibiotics Rash    Home Medications:  (Not in a hospital admission)  OB/GYN Status:  No LMP recorded.  General Assessment Data Location of Assessment: [facility] TTS Assessment: In system Is this a Tele or Face-to-Face Assessment?: Face-to-Face Is this an Initial Assessment or a Re-assessment for this encounter?: Initial Assessment Marital status: Widowed Is patient pregnant?: No Pregnancy Status: No Living Arrangements: Alone (with dog Nanine Means) Can pt return to current living arrangement?: Yes Admission Status:  (IVC is pending ) Is patient capable of signing voluntary admission?: No Referral Source: Self/Family/Friend Insurance type: (SP)     Crisis Care Plan Living Arrangements: Alone (with dog Nanine Means) Name of Psychiatrist: Dr. Lendon Colonel Name of Therapist: Constance Holster  Education Status  Is patient currently in school?: No Current Grade: NA Highest grade of school patient has completed: Master's Name of school: NA Contact person: NA  Risk to self with the past 6 months Suicidal Ideation: No-Not Currently/Within Last 6 Months Has patient been a risk to self within the past 6 months prior to admission? : Yes Suicidal Intent:  (pt denies suicidal intent but took possible Xanax overdose) Has patient had any suicidal intent within the past 6 months prior to admission? :  (denies, "I have to be here for my family and my dog") Is patient at risk for suicide?: Yes Suicidal Plan?: Yes-Currently Present Has patient had any suicidal plan within the past 6 months prior to admission? : Yes Specify Current Suicidal Plan: pt reports she has thought of overdosing, gasing herself in the garage, shooting herself Access to Means: Yes Specify Access to Suicidal Means: medications, car  What has been your use of drugs/alcohol within the last 12 months?: Pt reports she drinks on weekends 3-4, or 4-5 drinks Previous Attempts/Gestures: No How many times?: 0 Other Self Harm Risks:  none Triggers for Past Attempts: None known Intentional Self Injurious Behavior: None Family Suicide History: No Recent stressful life event(s): Loss (Comment) (loss of SO suddenly 2 years ago ) Persecutory voices/beliefs?: No Depression: Yes Depression Symptoms: Despondent, Insomnia, Tearfulness, Guilt, Loss of interest in usual pleasures, Feeling worthless/self pity, Feeling angry/irritable Substance abuse history and/or treatment for substance abuse?: No Suicide prevention information given to non-admitted patients: Not applicable  Risk to Others within the past 6 months Homicidal Ideation: No Does patient have any lifetime risk of violence toward others beyond the six months prior to admission? : No Thoughts of Harm to Others: No Current Homicidal Intent: No Current Homicidal Plan: No Access to Homicidal Means: No Identified Victim: none History of harm to others?: No Assessment of Violence: None Noted Violent Behavior Description: none Does patient have access to weapons?: No Criminal Charges Pending?: No Does patient have a court date: No Is patient on probation?: No  Psychosis Hallucinations: None noted Delusions: None noted  Mental Status Report Appearance/Hygiene: Unremarkable Eye Contact: Good Motor Activity: Unremarkable Speech: Slurred, Logical/coherent Level of Consciousness: Alert Mood: Depressed, Anxious Affect: Appropriate to circumstance Anxiety Level: Severe Thought Processes: Coherent, Relevant Judgement: Impaired Orientation: Person, Place, Situation (reports date is 6925 or 26 of July ) Obsessive Compulsive Thoughts/Behaviors: None  Cognitive Functioning Concentration: Decreased Memory: Recent Intact, Remote Intact IQ: Average Insight: Fair Impulse Control: Poor Appetite: Good Weight Loss: 0 Weight Gain: 0 Sleep: Decreased Total Hours of Sleep: 4 Vegetative Symptoms: None  ADLScreening [facility] Assessment Services) Patient's cognitive ability  adequate to safely complete daily activities?: Yes Patient able to express need for assistance with ADLs?: Yes Independently performs ADLs?: Yes (appropriate for developmental age)  Prior Inpatient Therapy Prior Inpatient Therapy: No Prior Therapy Dates: NA Prior Therapy Facilty/Provider(s): NA Reason for Treatment: NA  Prior Outpatient Therapy Prior Outpatient Therapy: Yes Prior Therapy Dates: current for past three years Prior Therapy Facilty/Provider(s): Dr. Lendon ColonelHawks, and Margret Sallinger Reason for Treatment: medication management, therapy  Does patient have an ACCT team?: No Does patient have Intensive In-House Services?  : No Does patient have [facility] services? : No Does patient have P4CC services?: No  ADL Screening (condition at time of admission) Patient's cognitive ability adequate to safely complete daily activities?: Yes Patient able to express need for assistance with ADLs?: Yes Independently performs ADLs?: Yes (appropriate for developmental age)  Abuse/Neglect Assessment (Assessment to be complete while patient is alone) Physical Abuse: Denies Verbal Abuse: Denies Sexual Abuse: Denies Exploitation of patient/patient's resources: Denies Self-Neglect: Denies Values / Beliefs Cultural Requests During Hospitalization: None Spiritual Requests During Hospitalization: None   Advance Directives (For Healthcare) Does patient have an advance directive?: No Would patient like information on creating an advanced directive?: No - patient declined information    Additional Information 1:1 In Past 12 Months?: No CIRT Risk: No Elopement Risk: No Does patient have medical clearance?: No (Per Poison Control observe for 6 hours )     Disposition:  Per Donell Sievert, PA pt to have an AM psych evaluation to uphold or rescind IVC. Informed Dr. Elesa Massed of recommendations and she is in agreement. Informed pt and and RN. Pt asked if she could leave with her mother if her  mother came to ED. Informed pt she would need to be seen by psychiatry prior to being discharged. She then became upset and said she would get fired tomorrow and lose her insurance, so the hospital should think about discharging her now as she would not have insurance to pay for service.      Clista Bernhardt, [facility] Triage Specialist 06/25/2015 12:26 AM  Disposition Initial Assessment Completed for this Encounter: Yes  Kaivon Livesey M 06/25/2015 12:24 AM

## 2015-06-25 NOTE — Consult Note (Signed)
[facility] Face-to-Face Psychiatry Consult   Reason for Consult:  Questionable overdose Referring Physician:  EDP Patient Identification: Renee Proctor MRN:  [id number] Principal Diagnosis: Substance induced mood disorder Diagnosis:   Patient Active Problem List   Diagnosis Date Noted  . Alcohol abuse [F10.10] 06/25/2015    Priority: High  . Substance induced mood disorder [F19.94] 06/25/2015    Priority: High  . Bereavement [Z63.4] 06/25/2015    Priority: High    Total Time spent with patient: 45 minutes  Subjective:   Renee Proctor is a 39 y.o. female patient does not warrant admission.  HPI:  The patient presented to the ED after her friends saw an empty medication bottle and thought she had overdosed.  She has been prescribed Xanax for anxiety and took two prior to going to a bar last night and drank.  When she returned home, she took another Xanax to go to sleep.  When her friends came by and saw an empty bottle with Renee Proctor being groggy.  Renee Proctor reports having her first black out and cannot remember anything from taking the Xanax and waking up in the hospital, which scares her greatly.  She denies suicidal ideations, past attempts, and hospitalizations.  Renee Proctor adamantly denies overdosing and is clear and coherent today.  If she had overdosed, she would at least be sleepy or groggy.  Renee Proctor states she had an empty bottle because she had combined her Xanax from last month (does not use them all) into another bottle.  She wants to return home to her dogs who give her comfort and has called her mother who is supportive.  Renee Proctor has been having some depression related to her husband's sudden death in a marathon two years ago.  She has been to counseling and is willing to return.  Renee Proctor has joined a gym and was suppose to go this morning prior to work.  She has a steady job and worked there for a few years.  Renee Proctor plans to wean off of her Xanax which was started after her husband passed.  She  denies suicidal ideations, past attempts, and psychiatric hospitalizations.  Denies homicidal ideations and violence, hallucinations.  Stable for discharge. HPI Elements:   Location:  generalized. Quality:  acute. Severity:  mild. Timing:  intermittent. Duration:  brief. Context:  stressors.  Past Medical History:  Past Medical History  Diagnosis Date  . Depression   . Anxiety   . Thyroid disease    No past surgical history on file. Family History:  Family History  Problem Relation Age of Onset  . Heart disease Mother 44  . Hypertension Mother   . Hyperlipidemia Mother   . Mental illness Father   . Mental illness Sister   . Diabetes Maternal Aunt   . Diabetes Maternal Uncle   . Mental illness Paternal Uncle   . Heart disease Maternal Grandmother   . Diabetes Maternal Grandmother   . Heart disease Maternal Grandfather   . Diabetes Maternal Grandfather   . Mental illness Paternal Grandfather    Social History:  History  Alcohol Use  . Yes    Comment: occ, 2-3 maybe 1-2 x per month     History  Drug Use No    History   Social History  . Marital Status: Widowed    Spouse Name: N/A  . Number of Children: N/A  . Years of Education: N/A   Social History Main Topics  . Smoking status: Never Smoker   . Smokeless  tobacco: Not on file  . Alcohol Use: Yes     Comment: occ, 2-3 maybe 1-2 x per month  . Drug Use: No  . Sexual Activity: Not on file   Other Topics Concern  . 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      Home Situation: lives alone      Spiritual Beliefs: atheist      Lifestyle: no regular exercise; diet is poor            Additional Social History:    Pain Medications: See PTA Prescriptions: SEE PTA Over the Counter: See PTA History of alcohol / drug use?: Yes Longest period of sobriety (when/how long): days to weeks, no hx of seizures reported  Negative Consequences of Use:   (denies) Withdrawal Symptoms:  (denies) Name of Substance 1: etoh  1 - Age of First Use: 17 1 - Amount (size/oz): 3-4 but reproted 4-5 drinks to RN 1 - Frequency: on weekends 1 - Duration: years 1 - Last Use / Amount: 06-24-15 3-4 drinks                   Allergies:   Allergies  Allergen Reactions  . Amoxicillin Rash  . Sulfa Antibiotics Rash    Labs:  Results for orders placed or performed during the hospital encounter of 06/24/15 (from the past 48 hour(s))  POC CBG, ED     Status: Abnormal   Collection Time: 06/24/15 11:23 PM  Result Value Ref Range   Glucose-Capillary 61 (L) 65 - 99 mg/dL  Urine rapid drug screen (hosp performed)not at [facility]     Status: Abnormal   Collection Time: 06/24/15 11:25 PM  Result Value Ref Range   Opiates NONE DETECTED NONE DETECTED   Cocaine NONE DETECTED NONE DETECTED   Benzodiazepines POSITIVE (A) NONE DETECTED   Amphetamines NONE DETECTED NONE DETECTED   Tetrahydrocannabinol NONE DETECTED NONE DETECTED   Barbiturates NONE DETECTED NONE DETECTED    Comment:        DRUG SCREEN FOR MEDICAL PURPOSES ONLY.  IF CONFIRMATION IS NEEDED FOR ANY PURPOSE, NOTIFY LAB WITHIN 5 DAYS.        LOWEST DETECTABLE LIMITS FOR URINE DRUG SCREEN Drug Class       Cutoff (ng/mL) Amphetamine      1000 Barbiturate      200 Benzodiazepine   937 Tricyclics       342 Opiates          300 Cocaine          300 THC              50   Urinalysis, Routine w reflex microscopic (not at [facility])     Status: Abnormal   Collection Time: 06/24/15 11:25 PM  Result Value Ref Range   Color, Urine YELLOW YELLOW   APPearance CLOUDY (A) CLEAR   Specific Gravity, Urine 1.003 (L) 1.005 - 1.030   pH 6.0 5.0 - 8.0   Glucose, UA NEGATIVE NEGATIVE mg/dL   Hgb urine dipstick TRACE (A) NEGATIVE   Bilirubin Urine NEGATIVE NEGATIVE   Ketones, ur NEGATIVE NEGATIVE mg/dL   Protein, ur NEGATIVE NEGATIVE mg/dL   Urobilinogen, UA 0.2 0.0 - 1.0 mg/dL   Nitrite NEGATIVE NEGATIVE    Leukocytes, UA SMALL (A) NEGATIVE  Urine microscopic-add on     Status: Abnormal   Collection Time: 06/24/15 11:25 PM  Result Value Ref Range  WBC, UA 3-6 <3 WBC/hpf   RBC / HPF 0-2 <3 RBC/hpf   Bacteria, UA MANY (A) RARE  CBC WITH DIFFERENTIAL     Status: None   Collection Time: 06/24/15 11:43 PM  Result Value Ref Range   WBC 7.0 4.0 - 10.5 K/uL   RBC 4.49 3.87 - 5.11 MIL/uL   Hemoglobin 14.9 12.0 - 15.0 g/dL   HCT 43.0 36.0 - 46.0 %   MCV 95.8 78.0 - 100.0 fL   MCH 33.2 26.0 - 34.0 pg   MCHC 34.7 30.0 - 36.0 g/dL   RDW 13.0 11.5 - 15.5 %   Platelets 222 150 - 400 K/uL   Neutrophils Relative % 67 43 - 77 %   Neutro Abs 4.7 1.7 - 7.7 K/uL   Lymphocytes Relative 28 12 - 46 %   Lymphs Abs 2.0 0.7 - 4.0 K/uL   Monocytes Relative 4 3 - 12 %   Monocytes Absolute 0.3 0.1 - 1.0 K/uL   Eosinophils Relative 1 0 - 5 %   Eosinophils Absolute 0.1 0.0 - 0.7 K/uL   Basophils Relative 0 0 - 1 %   Basophils Absolute 0.0 0.0 - 0.1 K/uL  Comprehensive metabolic panel     Status: Abnormal   Collection Time: 06/24/15 11:43 PM  Result Value Ref Range   Sodium 139 135 - 145 mmol/L   Potassium 4.0 3.5 - 5.1 mmol/L   Chloride 107 101 - 111 mmol/L   CO2 23 22 - 32 mmol/L   Glucose, Bld 87 65 - 99 mg/dL   BUN 10 6 - 20 mg/dL   Creatinine, Ser 0.79 0.44 - 1.00 mg/dL   Calcium 9.1 8.9 - 10.3 mg/dL   Total Protein 7.4 6.5 - 8.1 g/dL   Albumin 3.9 3.5 - 5.0 g/dL   AST 36 15 - 41 U/L   ALT 24 14 - 54 U/L   Alkaline Phosphatase 37 (L) 38 - 126 U/L   Total Bilirubin 0.8 0.3 - 1.2 mg/dL   GFR calc non Af Amer >60 >60 mL/min   GFR calc Af Amer >60 >60 mL/min    Comment: (NOTE) The eGFR has been calculated using the CKD EPI equation. This calculation has not been validated in all clinical situations. eGFR's persistently <60 mL/min signify possible Chronic Kidney Disease.    Anion gap 9 5 - 15  Ethanol     Status: Abnormal   Collection Time: 06/24/15 11:43 PM  Result Value Ref Range   Alcohol,  Ethyl (B) 121 (H) <5 mg/dL    Comment:        LOWEST DETECTABLE LIMIT FOR SERUM ALCOHOL IS 5 mg/dL FOR MEDICAL PURPOSES ONLY   POC Urine Pregnancy, ED  (not at [facility])     Status: None   Collection Time: 06/24/15 11:48 PM  Result Value Ref Range   Preg Test, Ur NEGATIVE NEGATIVE    Comment:        THE SENSITIVITY OF THIS METHODOLOGY IS >24 mIU/mL   Acetaminophen level     Status: Abnormal   Collection Time: 06/25/15  5:25 AM  Result Value Ref Range   Acetaminophen (Tylenol), Serum <10 (L) 10 - 30 ug/mL    Comment:        THERAPEUTIC CONCENTRATIONS VARY SIGNIFICANTLY. A RANGE OF 10-30 ug/mL MAY BE AN EFFECTIVE CONCENTRATION FOR MANY PATIENTS. HOWEVER, SOME ARE BEST TREATED AT CONCENTRATIONS OUTSIDE THIS RANGE. ACETAMINOPHEN CONCENTRATIONS >150 ug/mL AT 4 HOURS AFTER INGESTION AND >50 ug/mL AT  12 HOURS AFTER INGESTION ARE OFTEN ASSOCIATED WITH TOXIC REACTIONS.   Salicylate level     Status: None   Collection Time: 06/25/15  5:25 AM  Result Value Ref Range   Salicylate Lvl <8.3 2.8 - 30.0 mg/dL    Vitals: Blood pressure 130/85, pulse 77, temperature 98.6 F (37 C), temperature source Oral, resp. rate 19, height _0  (1.6 m), weight 55.792 kg (123 lb), SpO2 100 %.  Risk to Self: Suicidal Ideation: No-Not Currently/Within Last 6 Months Suicidal Intent:  (pt denies suicidal intent but took possible Xanax overdose) Is patient at risk for suicide?: Yes Suicidal Plan?: Yes-Currently Present Specify Current Suicidal Plan: pt reports she has thought of overdosing, gasing herself in the garage, shooting herself Access to Means: Yes Specify Access to Suicidal Means: medications, car  What has been your use of drugs/alcohol within the last 12 months?: Pt reports she drinks on weekends 3-4, or 4-5 drinks How many times?: 0 Other Self Harm Risks: none Triggers for Past Attempts: None known Intentional Self Injurious Behavior: None Risk to Others: Homicidal Ideation: No Thoughts  of Harm to Others: No Current Homicidal Intent: No Current Homicidal Plan: No Access to Homicidal Means: No Identified Victim: none History of harm to others?: No Assessment of Violence: None Noted Violent Behavior Description: none Does patient have access to weapons?: No Criminal Charges Pending?: No Does patient have a court date: No Prior Inpatient Therapy: Prior Inpatient Therapy: No Prior Therapy Dates: NA Prior Therapy Facilty/Provider(s): NA Reason for Treatment: NA Prior Outpatient Therapy: Prior Outpatient Therapy: Yes Prior Therapy Dates: current for past three years Prior Therapy Facilty/Provider(s): Dr. Lenna Gilford, and [place] Reason for Treatment: medication management, therapy  Does patient have an ACCT team?: No Does patient have Intensive In-House Services?  : No Does patient have [facility] services? : No Does patient have P4CC services?: No  Current Facility-Administered Medications  Medication Dose Route Frequency Provider Last Rate Last Dose  . buPROPion (WELLBUTRIN XL) 24 hr tablet 150 mg  150 mg Oral BID Kristen N Ward, DO   150 mg at 06/25/15 1013  . fluticasone (FLONASE) 50 MCG/ACT nasal spray 1 spray  1 spray Each Nare Daily Kristen N Ward, DO   1 spray at 06/25/15 1016  . glycopyrrolate (ROBINUL) tablet 2 mg  2 mg Oral TID Kristen N Ward, DO   2 mg at 06/25/15 1013  . levonorgestrel-ethinyl estradiol (SEASONALE,INTROVALE,JOLESSA) 0.15-0.03 MG per tablet 1 tablet  1 tablet Oral Daily Kristen N Ward, DO   1 tablet at 06/25/15 1014  . loratadine (CLARITIN) tablet 10 mg  10 mg Oral Daily Kristen N Ward, DO   10 mg at 06/25/15 1013  . pantoprazole (PROTONIX) EC tablet 40 mg  40 mg Oral Daily Kristen N Ward, DO   40 mg at 06/25/15 1013  . sodium chloride 0.9 % bolus 1,000 mL  1,000 mL Intravenous Once Delice Bison Ward, DO       Current Outpatient Prescriptions  Medication Sig Dispense Refill  . ALPRAZolam (XANAX) 1 MG tablet Take 1 mg by mouth 3 (three) times  daily as needed for anxiety.    Marland Kitchen amphetamine-dextroamphetamine (ADDERALL) 20 MG tablet Take 20 mg by mouth daily.    Marland Kitchen buPROPion (WELLBUTRIN XL) 150 MG 24 hr tablet Take 150 mg by mouth 2 (two) times daily.    . fexofenadine (ALLEGRA) 180 MG tablet Take 180 mg by mouth daily.    . fluticasone (FLONASE) 50 MCG/ACT nasal spray Place  1 spray into both nostrils daily.    Marland Kitchen glycopyrrolate (ROBINUL) 2 MG tablet Take 2 mg by mouth 3 (three) times daily.    Marland Kitchen levonorgestrel-ethinyl estradiol (SEASONALE,INTROVALE,JOLESSA) 0.15-0.03 MG tablet Take 1 tablet by mouth daily.    . Multiple Vitamin (MULTIVITAMIN WITH MINERALS) TABS tablet Take 1 tablet by mouth daily.    Marland Kitchen omeprazole (PRILOSEC) 20 MG capsule Take 20 mg by mouth 2 (two) times daily.    Marland Kitchen zolpidem (AMBIEN CR) 12.5 MG CR tablet Take 12.5 mg by mouth at bedtime as needed. sleep      Musculoskeletal: Strength & Muscle Tone: within normal limits Gait & Station: normal Patient leans: N/A  Psychiatric Specialty Exam: Physical Exam  Review of Systems  Constitutional: Negative.   HENT: Negative.   Eyes: Negative.   Respiratory: Negative.   Cardiovascular: Negative.   Gastrointestinal: Negative.   Genitourinary: Negative.   Musculoskeletal: Negative.   Skin: Negative.   Neurological: Negative.   Endo/Heme/Allergies: Negative.   Psychiatric/Behavioral: Positive for depression and substance abuse. The patient is nervous/anxious.     Blood pressure 130/85, pulse 77, temperature 98.6 F (37 C), temperature source Oral, resp. rate 19, height _0  (1.6 m), weight 55.792 kg (123 lb), SpO2 100 %.Body mass index is 21.79 kg/(m^2).  General Appearance: Casual  Eye Contact::  Good  Speech:  Normal Rate  Volume:  Normal  Mood:  Depressed mild  Affect:  Congruent  Thought Process:  Coherent  Orientation:  Full (Time, Place, and Person)  Thought Content:  WDL  Suicidal Thoughts:  No  Homicidal Thoughts:  No  Memory:  Immediate;    Good Recent;   Fair Remote;   Good  Judgement:  Fair  Insight:  Good  Psychomotor Activity:  Normal  Concentration:  Good  Recall:  Good  Fund of Knowledge:Good  Language: Good  Akathisia:  No  Handed:  Right  AIMS (if indicated):     Assets:  Communication Skills Desire for Improvement Financial Resources/Insurance Housing Leisure Time Physical Health Resilience Social Support Talents/Skills Transportation Vocational/Educational  ADL's:  Intact  Cognition: WNL  Sleep:      Medical Decision Making: Review of Psycho-Social Stressors (1), Review or order clinical lab tests (1) and Review of Medication Regimen & Side Effects (2)  Treatment Plan Summary: Daily contact with patient to assess and evaluate symptoms and progress in treatment, Medication management and Plan Discharge home with her mother, follow-up with counseling--resources provided  Plan:  No evidence of imminent risk to self or others at present.   Disposition: Discharge home with her mother, follow-up with counseling--resources provided  Waylan Boga, [place] 06/25/2015 12:58 PM

## 2016-06-29 ENCOUNTER — Other Ambulatory Visit: Payer: Self-pay | Admitting: Obstetrics and Gynecology

## 2016-06-29 DIAGNOSIS — R928 Other abnormal and inconclusive findings on diagnostic imaging of breast: Secondary | ICD-10-CM

## 2016-07-05 ENCOUNTER — Ambulatory Visit
Admission: RE | Admit: 2016-07-05 | Discharge: 2016-07-05 | Disposition: A | Discharge status: Home / Self Care | Payer: BLUE CROSS/BLUE SHIELD | Source: Ambulatory Visit | Attending: Obstetrics and Gynecology | Admitting: Obstetrics and Gynecology

## 2016-07-05 ENCOUNTER — Other Ambulatory Visit: Payer: Self-pay | Admitting: Obstetrics and Gynecology

## 2016-07-05 DIAGNOSIS — N6320 Unspecified lump in the left breast, unspecified quadrant: Secondary | ICD-10-CM

## 2016-07-05 DIAGNOSIS — R928 Other abnormal and inconclusive findings on diagnostic imaging of breast: Secondary | ICD-10-CM

## 2016-07-06 ENCOUNTER — Other Ambulatory Visit: Payer: Self-pay | Admitting: Obstetrics and Gynecology

## 2016-07-06 ENCOUNTER — Ambulatory Visit
Admission: RE | Admit: 2016-07-06 | Discharge: 2016-07-06 | Disposition: A | Discharge status: Home / Self Care | Payer: BLUE CROSS/BLUE SHIELD | Source: Ambulatory Visit | Attending: Obstetrics and Gynecology | Admitting: Obstetrics and Gynecology

## 2016-07-06 DIAGNOSIS — N6320 Unspecified lump in the left breast, unspecified quadrant: Secondary | ICD-10-CM

## 2016-10-29 ENCOUNTER — Other Ambulatory Visit: Payer: Self-pay | Admitting: Obstetrics and Gynecology

## 2016-10-29 DIAGNOSIS — N6002 Solitary cyst of left breast: Secondary | ICD-10-CM

## 2017-01-07 ENCOUNTER — Other Ambulatory Visit: Payer: Self-pay

## 2017-01-26 ENCOUNTER — Ambulatory Visit
Admission: RE | Admit: 2017-01-26 | Discharge: 2017-01-26 | Disposition: A | Discharge status: Home / Self Care | Payer: Self-pay | Source: Ambulatory Visit | Attending: Obstetrics and Gynecology | Admitting: Obstetrics and Gynecology

## 2017-01-26 ENCOUNTER — Other Ambulatory Visit: Payer: Self-pay

## 2017-01-26 DIAGNOSIS — N6002 Solitary cyst of left breast: Secondary | ICD-10-CM

## 2018-02-09 IMAGING — US ULTRASOUND RIGHT BREAST LIMITED
1 series · 5 of 5 positions shown · non-contrast
Comparison: Previous exam(s).

CLINICAL DATA: Screening recall for bilateral breast masses.

EXAM:
2D DIGITAL DIAGNOSTIC BILATERAL MAMMOGRAM WITH CAD AND ADJUNCT TOMO
BILATERAL BREAST ULTRASOUND

[Series 1: ultrasound right breast limited · 0.05mm/px · 5 of 5 slices shown]
[im 1/5]
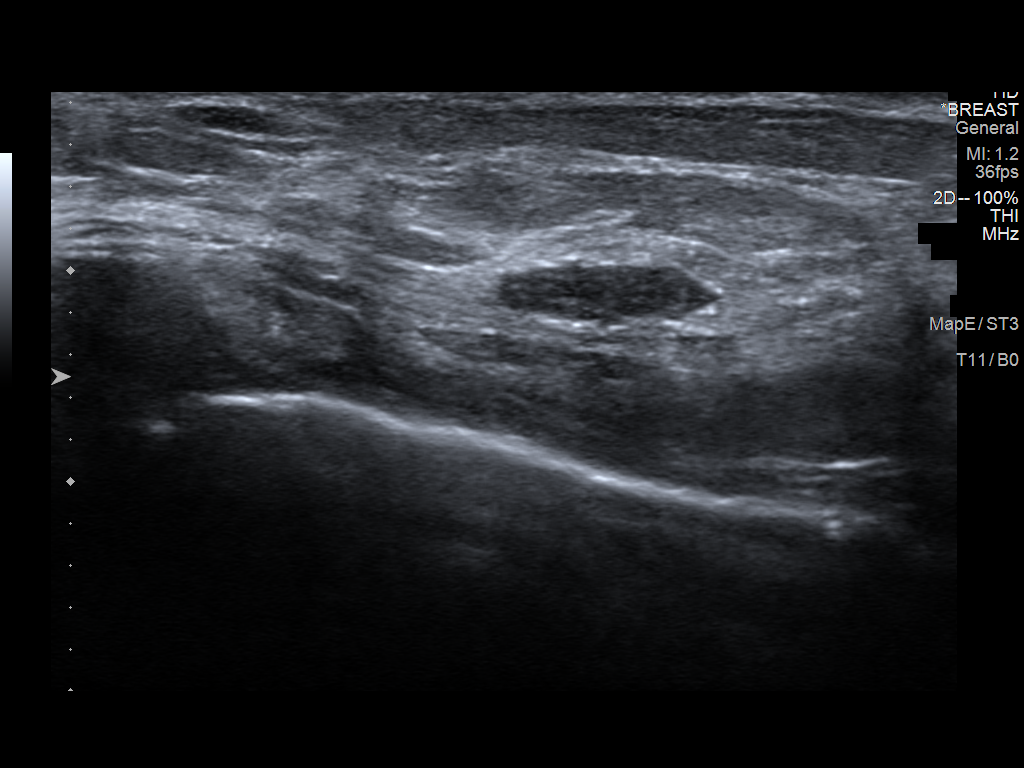
[im 2/5]
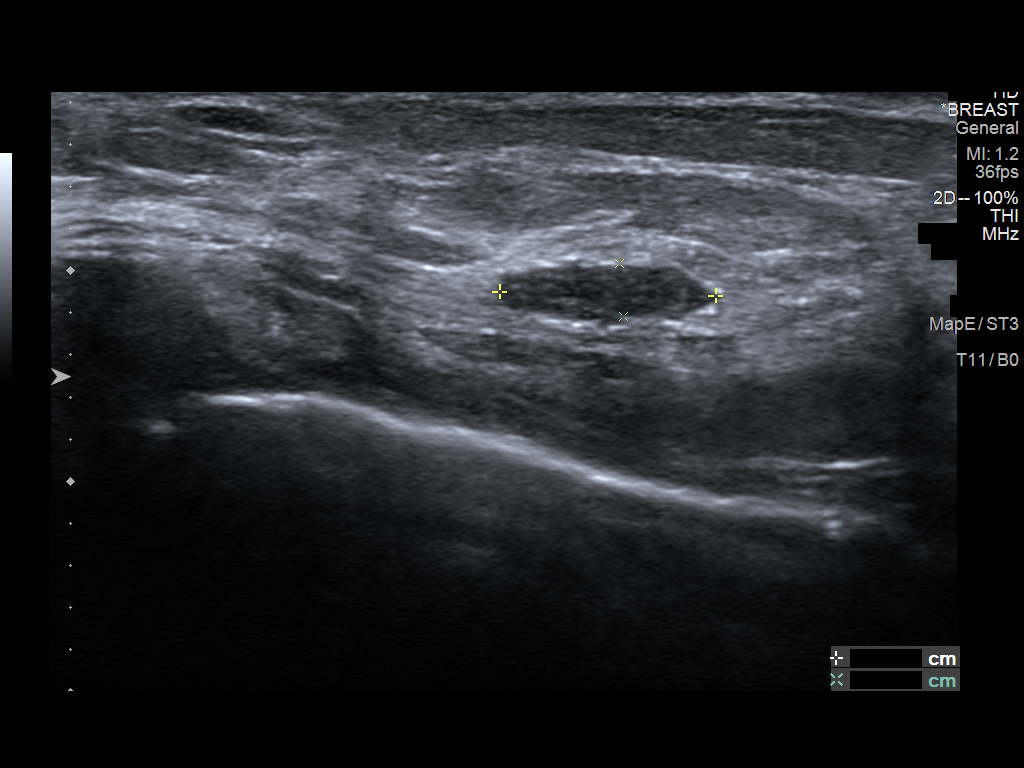
[im 3/5]
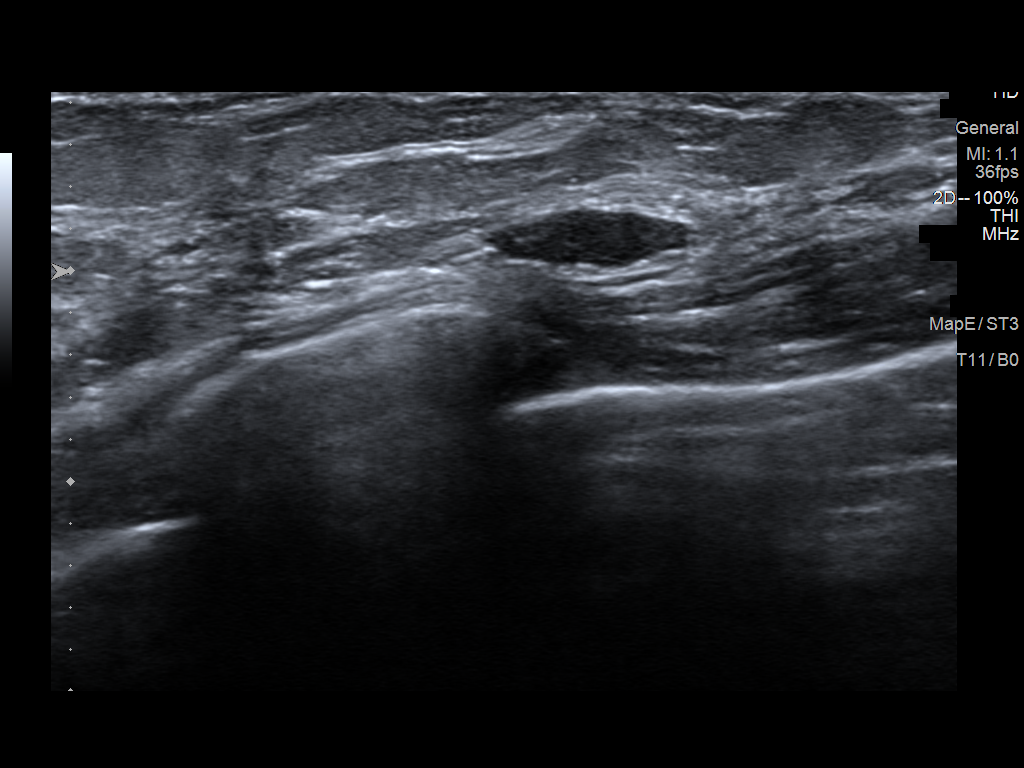
[im 4/5]
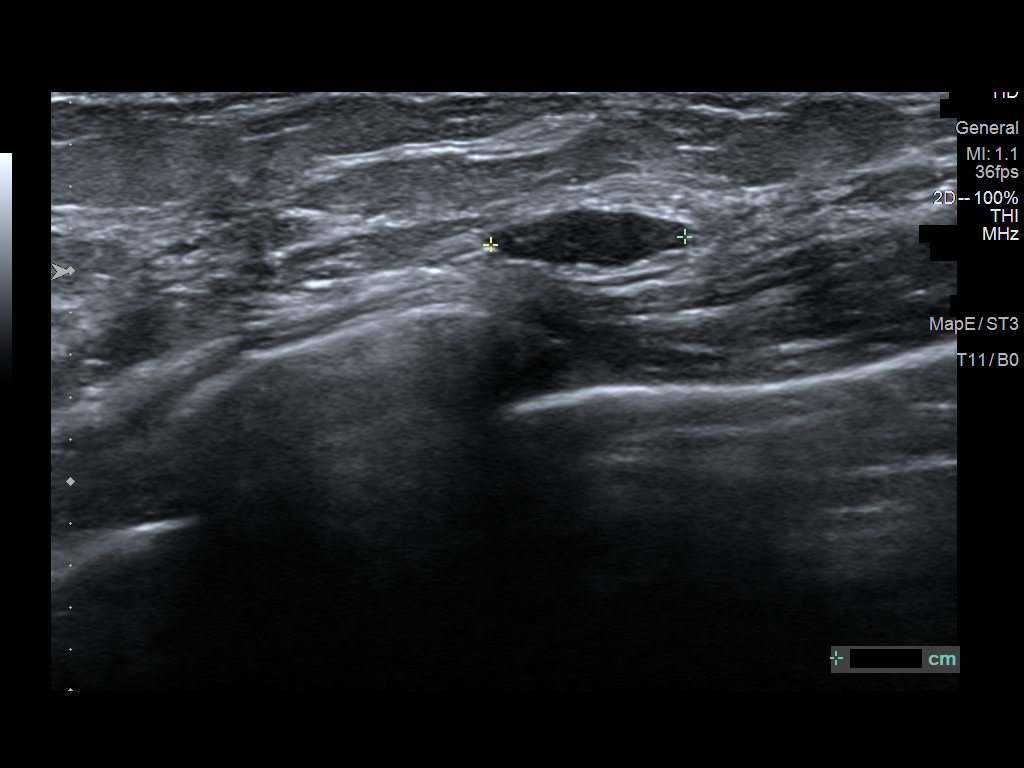
[im 5/5]
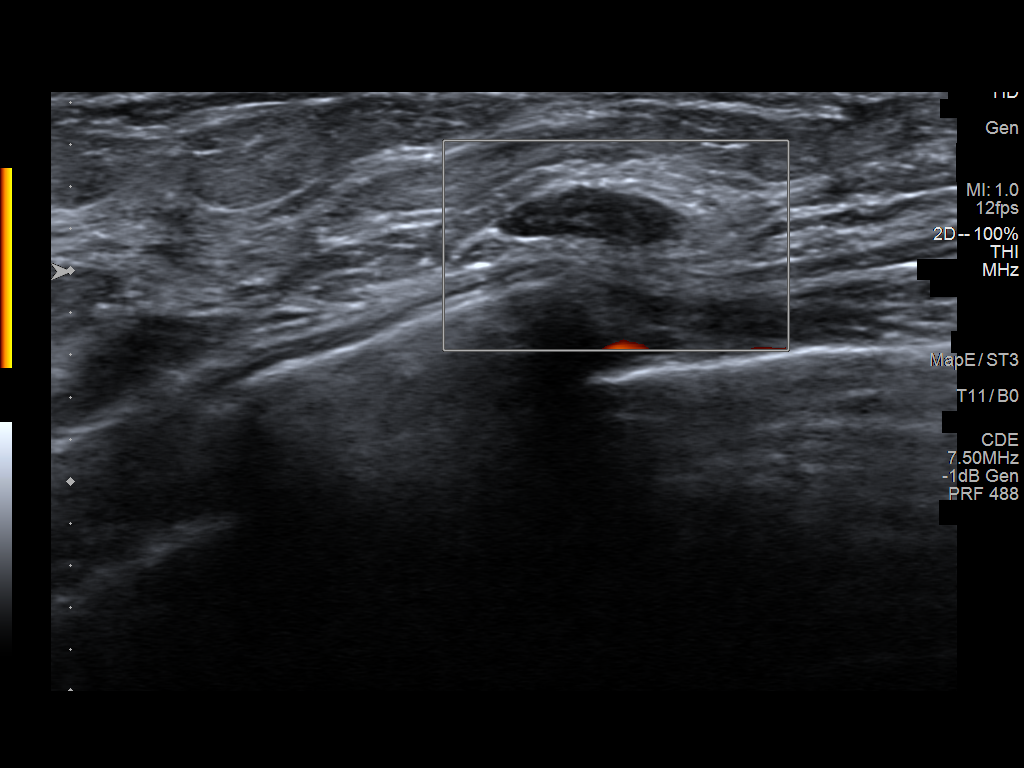

[5 of 5 positions shown; findings below may reference images not displayed]

ACR Breast Density Category c: The breast tissue is heterogeneously
dense, which may obscure small masses.
FINDINGS: In the inferior right breast there is an oval circumscribed mass,
which does not appear significantly different from the appearance on
the CC view from 7777. In the upper-outer quadrant of the left
breast there is an oval mass which does appear new from prior in the
middle to posterior depth. In the lower outer quadrant of the left
breast there is a third oval circumscribed mass in the posterior
depth, which in retrospect appears similar to that seen on the 9396
exam.

Mammographic images were processed with CAD.

Physical exam of the inferior right breast demonstrates no discrete
palpable masses.

Physical exam of the superior left breast demonstrates a superficial
firm mass at approximately 1 o'clock.

Physical exam of the lower outer quadrant of the left breast
demonstrates no discrete palpable masses.

Ultrasound of right breast at 5 o'clock, 4 cm from the nipple
demonstrates a homogeneous hypoechoic circumscribed oval mass
measuring 1.0 x 0.3 x 0.9 cm.

Ultrasound of the left breast at 1 o'clock, 4 cm from the nipple
demonstrates an irregular hypoechoic mass with partially indistinct
margins measuring 1.1 x 0.8 x 1.0 cm. Significant blood flow is
identified on color Doppler imaging.

Ultrasound of the left breast at 4 o'clock, 4 cm from the nipple
demonstrates a hypoechoic circumscribed oval mass measuring 0.7 x
0.3 x 0.6 cm.

Ultrasound of the left axilla demonstrates multiple normal-appearing
lymph nodes.
IMPRESSION: 1. There is an indeterminate mass in the left breast at 1 o'clock,
which was not seen on prior mammograms.

2.  No evidence of left axillary lymphadenopathy.

3. Other bilateral circumscribed masses are stable, with benign
features, most likely representing fibroadenomas.

RECOMMENDATION:
Ultrasound-guided biopsy is recommended for the left breast mass at
1 o'clock. This has been scheduled for 07/06/2016 at [DATE] a.m..

I have discussed the findings and recommendations with the patient.
Results were also provided in writing at the conclusion of the
visit. If applicable, a reminder letter will be sent to the patient
regarding the next appointment.

BI-RADS CATEGORY  4: Suspicious.

## 2018-02-10 IMAGING — US US BREAST BX W LOC DEV 1ST LESION IMG BX SPEC US GUIDE*L*
1 series · 8 of 8 positions shown · non-contrast
Comparison: Previous exam(s).

ADDENDUM:
Pathology revealed a fibroadenoma in the left breast. This was found
to be concordant by Dr. Jaylon Aujla. Pathology results were discussed
with the patient by telephone. The patient reported doing well after
the biopsy. Post biopsy instructions and care were reviewed and
questions were answered. The patient was encouraged to call The
patient was instructed to return for left diagnostic mammography and
possible ultrasound in 6 months and informed that a reminder letter
would be sent regarding this appointment.

Pathology results reported by Tarniem Elmaadawy RN, BSN on 07/08/2016.
CLINICAL DATA: 40-year-old female for tissue sampling of mass in
the upper outer left breast.
EXAM:
ULTRASOUND GUIDED LEFT BREAST CORE NEEDLE BIOPSY

[Series 1: us breast bx w loc dev 1st lesion img bx spec us g · 0.05mm/px · 8 of 8 slices shown]
[im 1/8]
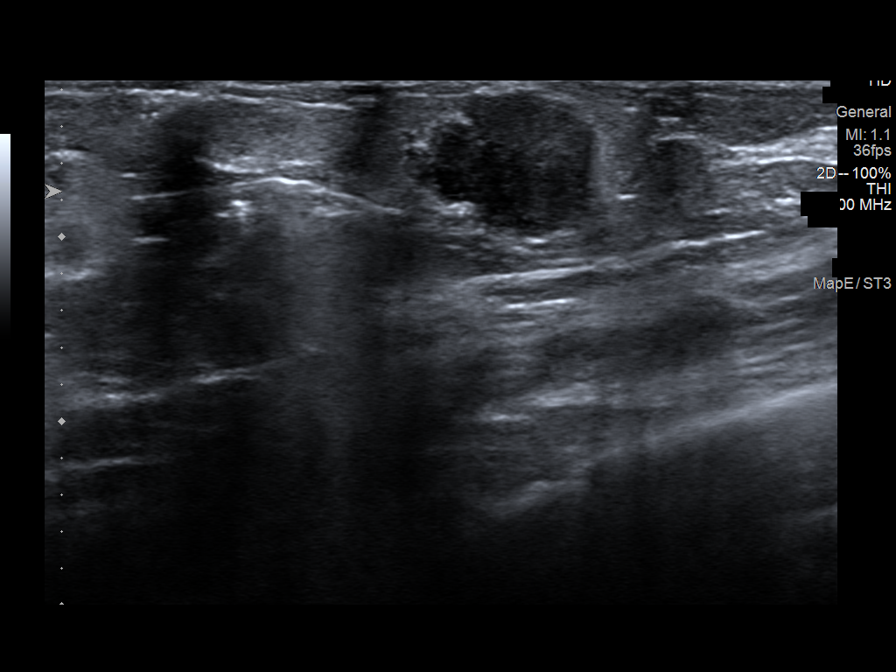
[im 2/8]
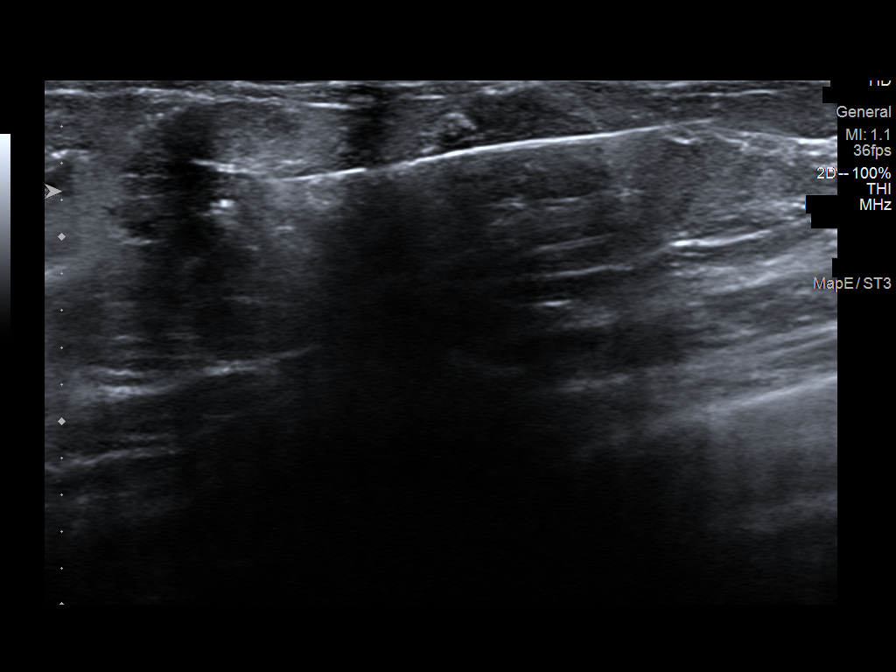
[im 3/8]
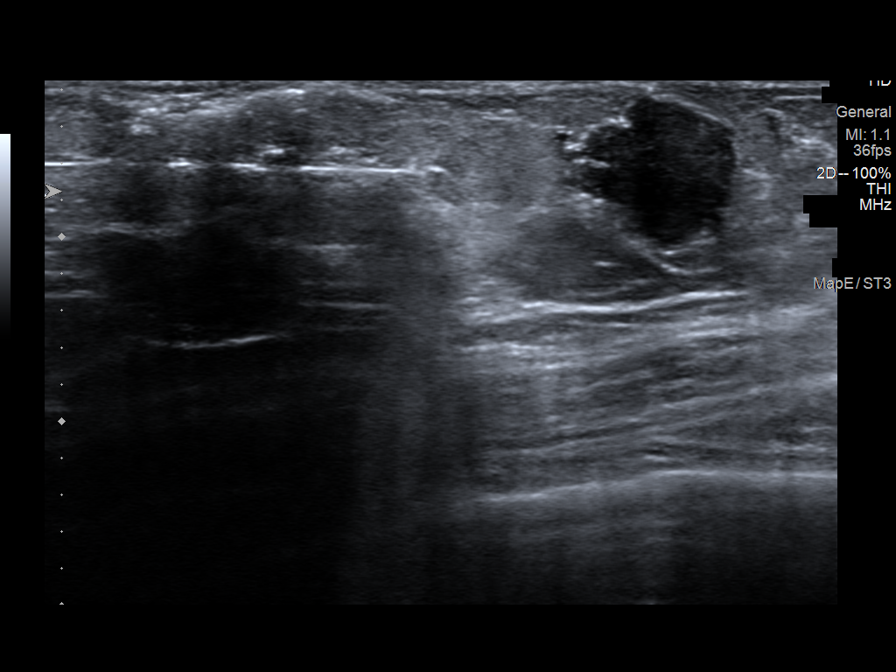
[im 4/8]
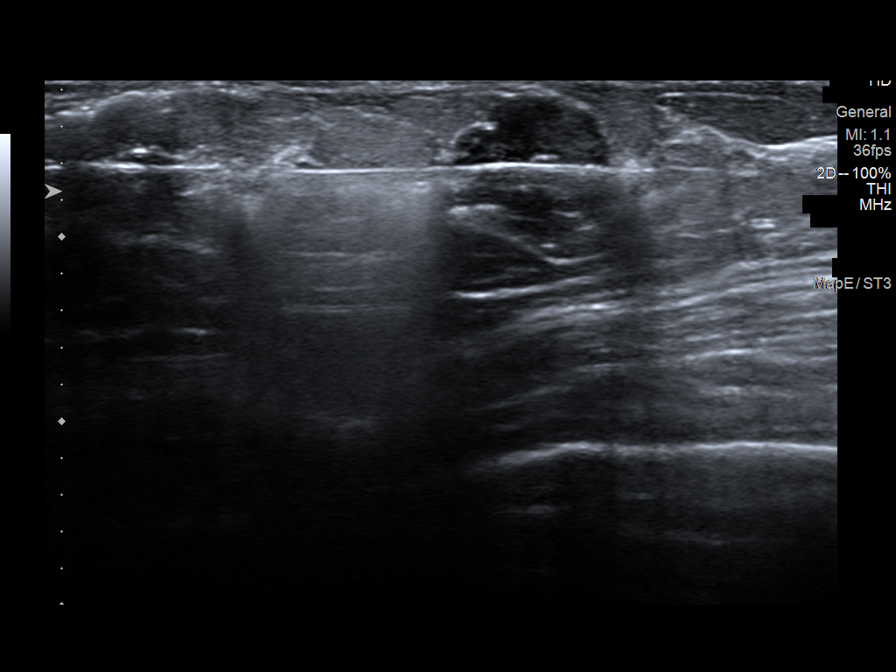
[im 5/8]
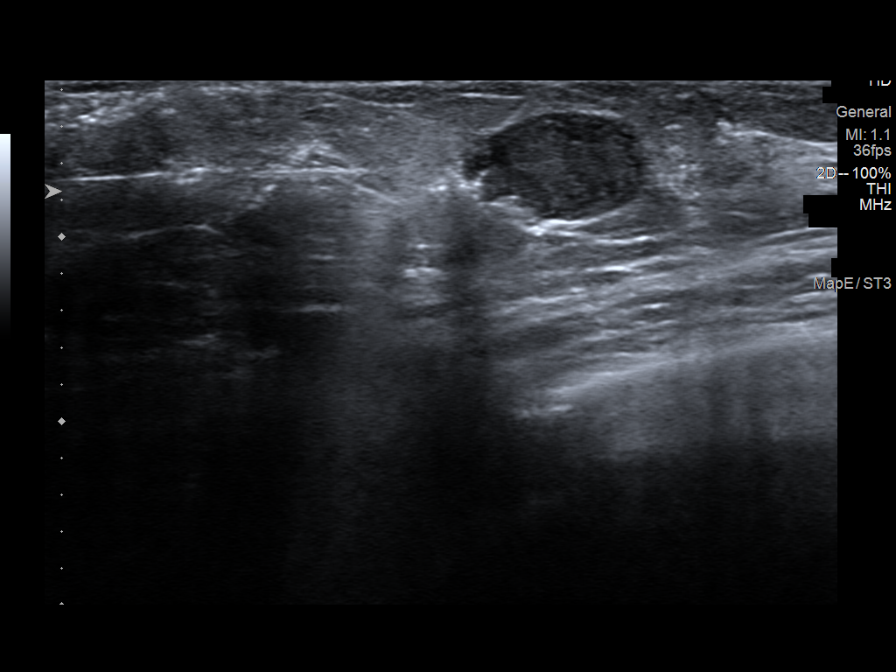
[im 6/8]
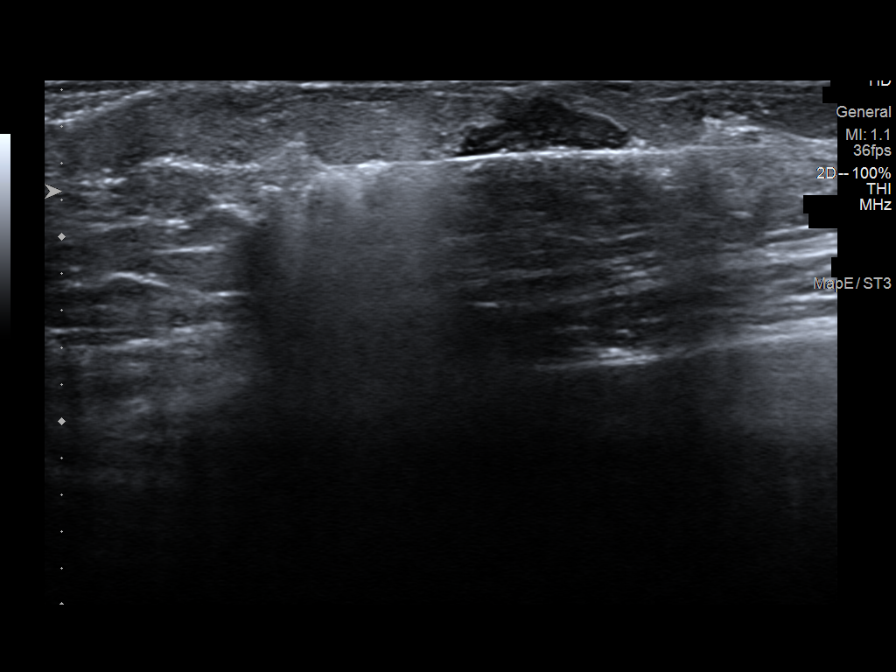
[im 7/8]
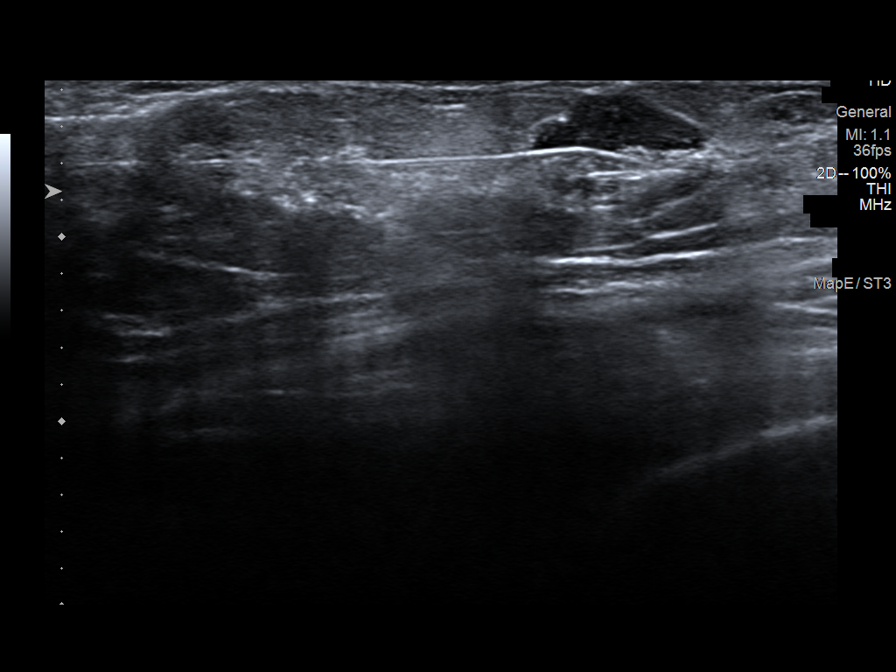
[im 8/8]
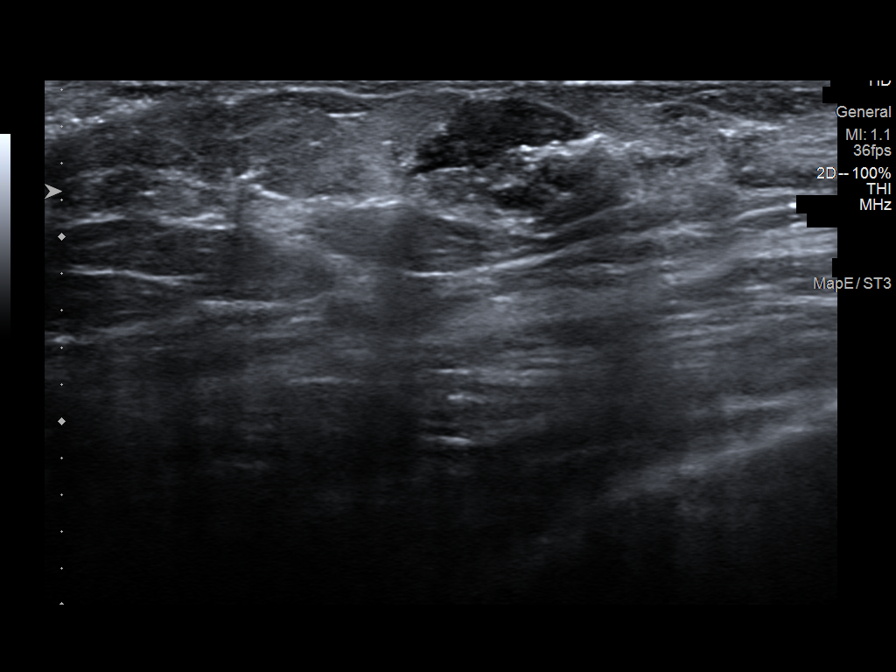

[8 of 8 positions shown; findings below may reference images not displayed]



Using sterile technique and 1% Lidocaine as local anesthetic, under
direct ultrasound visualization, a 12 gauge Drey device was
used to perform biopsy of the 1.1 cm hypoechoic mass at the 1
o'clock position of the left breast 4 cm from the nipple using a
medial approach. At the conclusion of the procedure a ribbon shaped
tissue marker clip was deployed into the biopsy cavity. Follow up 2
view mammogram was performed and dictated separately.
IMPRESSION: Ultrasound guided biopsy of mass in the upper outer left breast.. No
apparent complications.

Pathology will be followed.

## 2018-09-02 IMAGING — MG 2D DIGITAL DIAGNOSTIC UNILATERAL LEFT MAMMOGRAM WITH CAD AND ADJ
6 series · 6 of 14 positions shown · non-contrast
Comparison: Previous exam(s).

CLINICAL DATA: Patient had a ultrasound-guided core needle biopsy
of left breast mass demonstrating a benign fibroadenoma.

EXAM:
2D DIGITAL DIAGNOSTIC UNILATERAL LEFT MAMMOGRAM WITH CAD AND ADJUNCT
TOMO

[L MLO synth-2D]
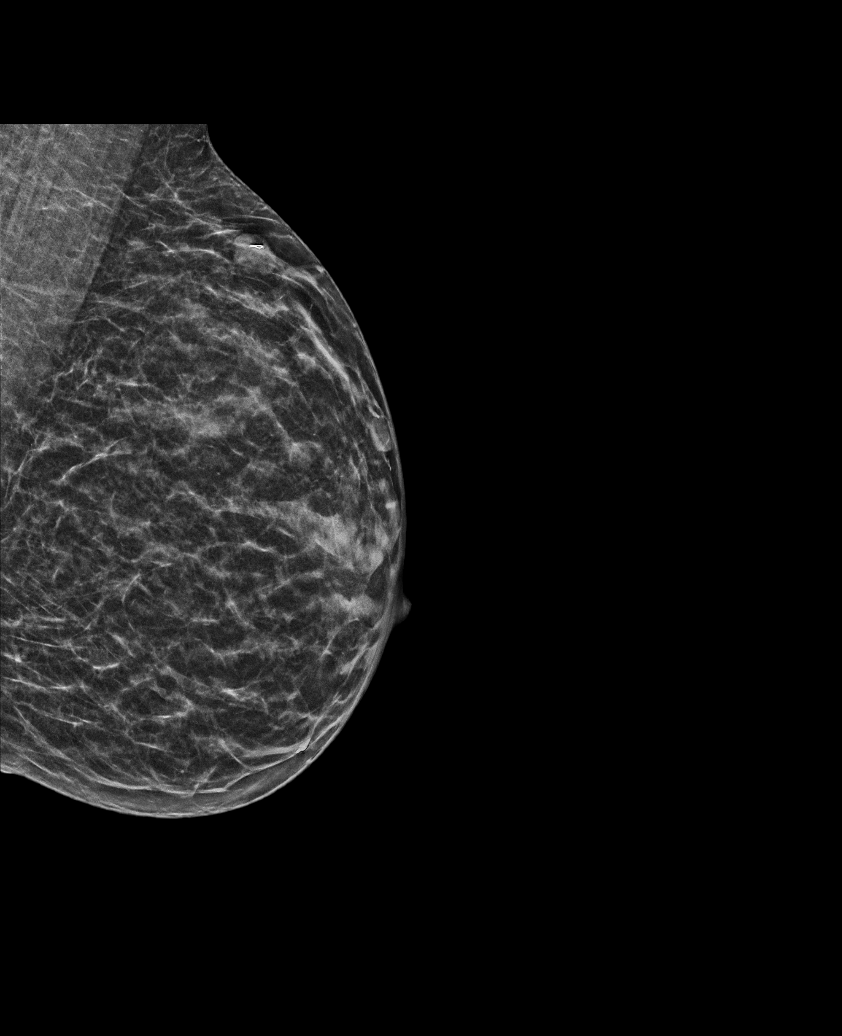

[L CC]
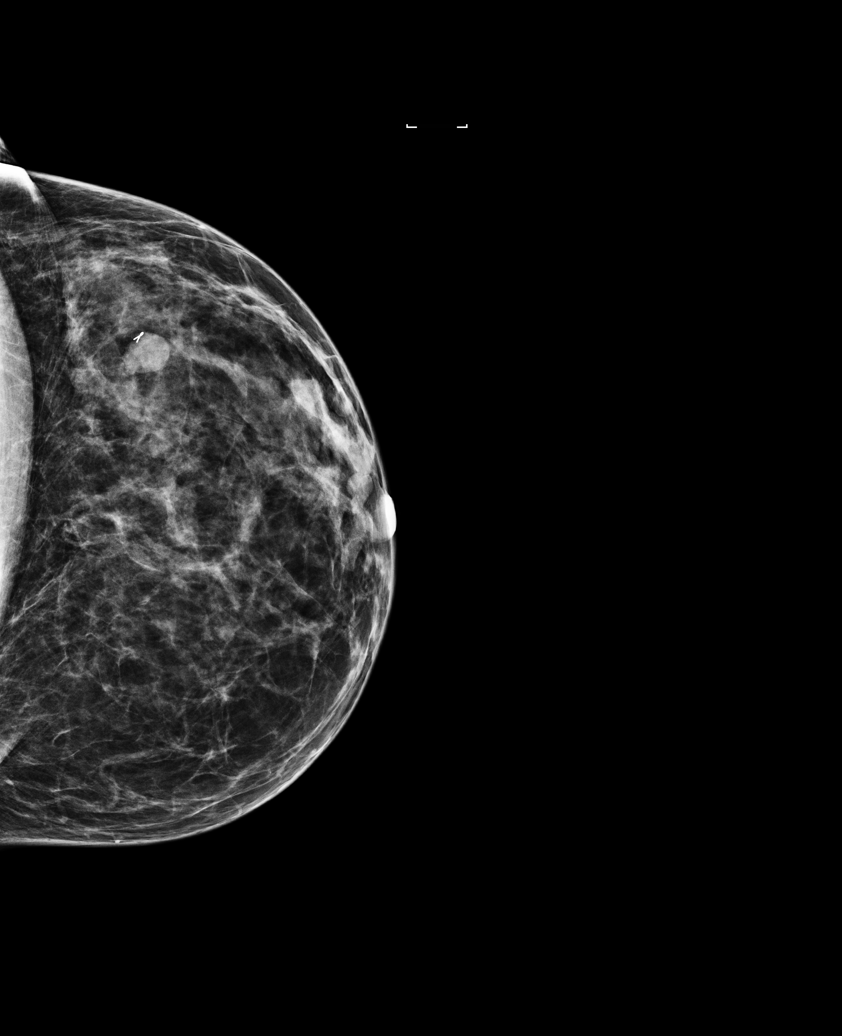

[L CC synth-2D]
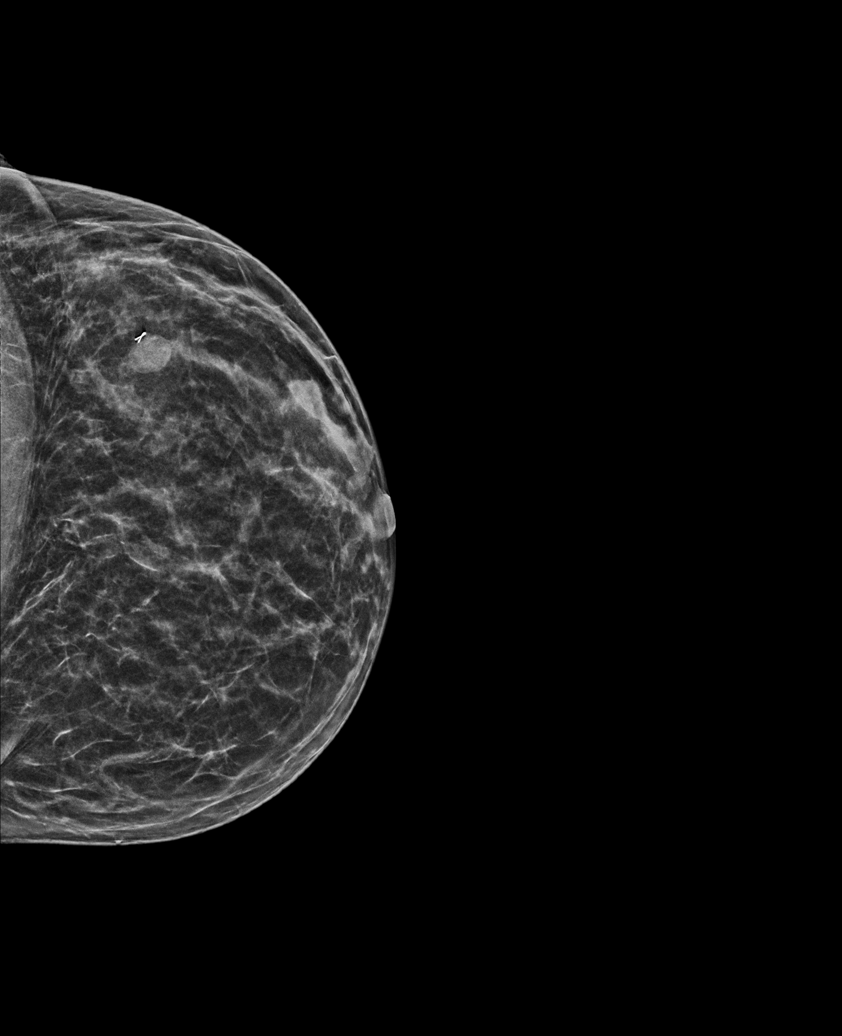

[L MLO]
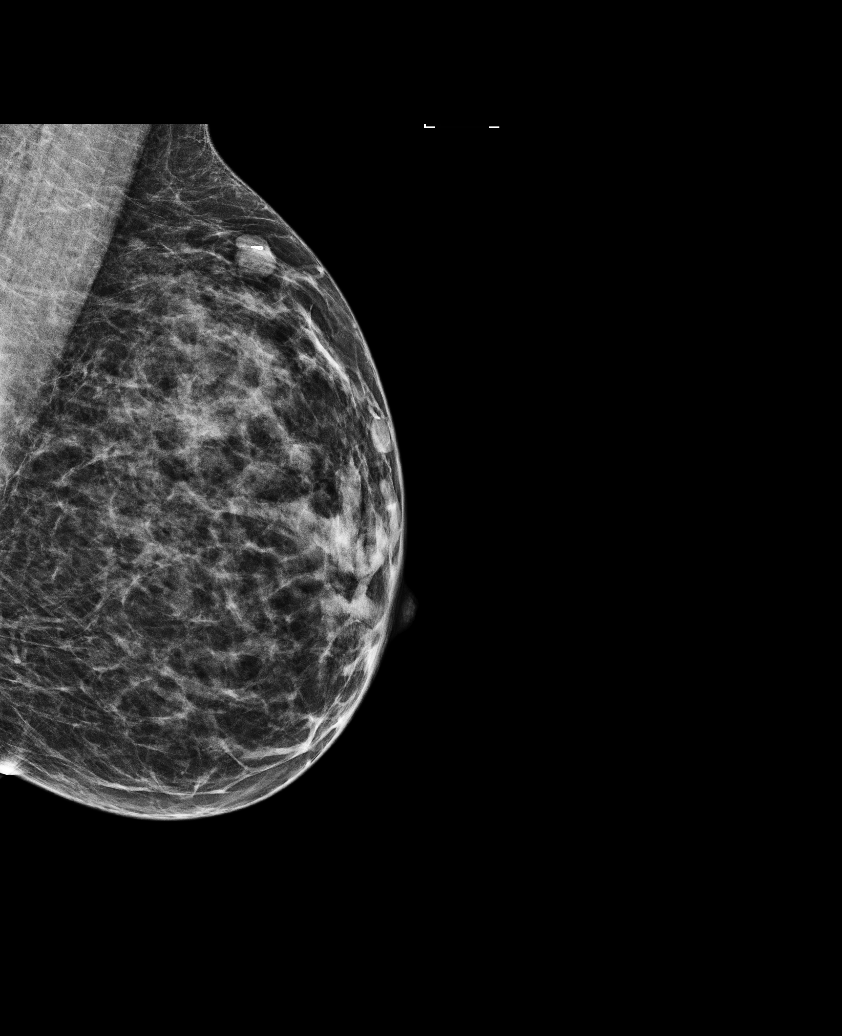

[L CC tomo · tomo slice 23/46.0]
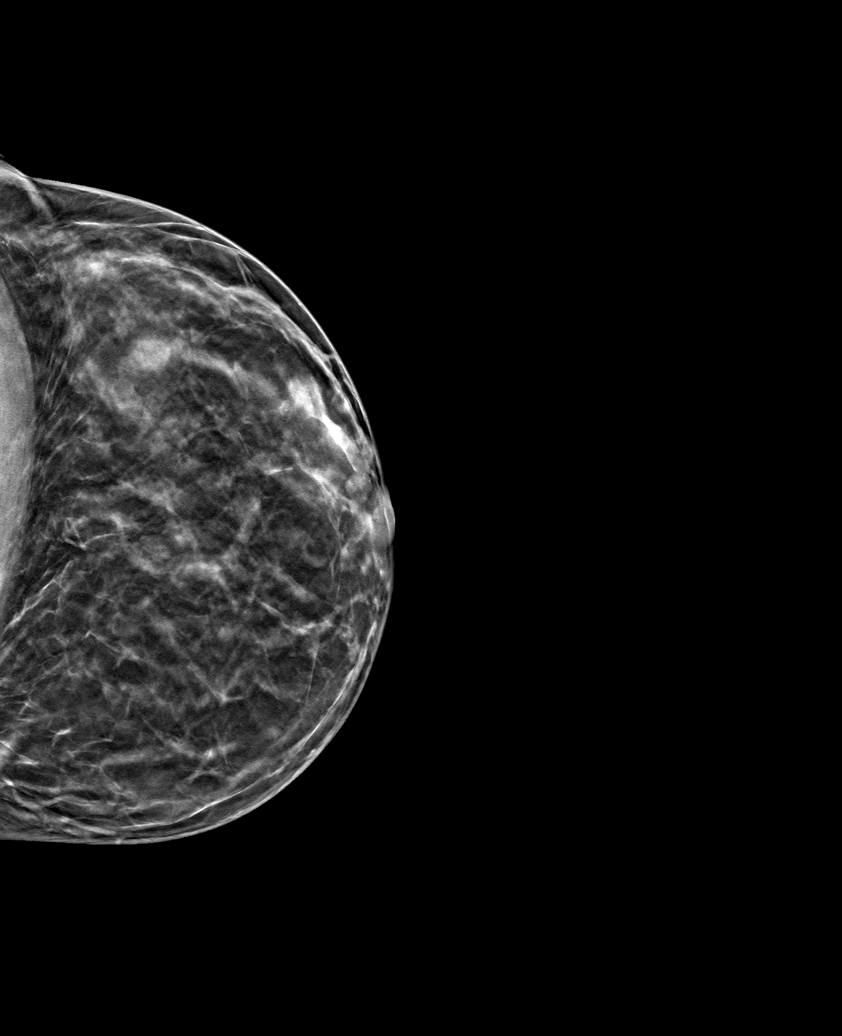

[L MLO tomo · tomo slice 23/44.0]
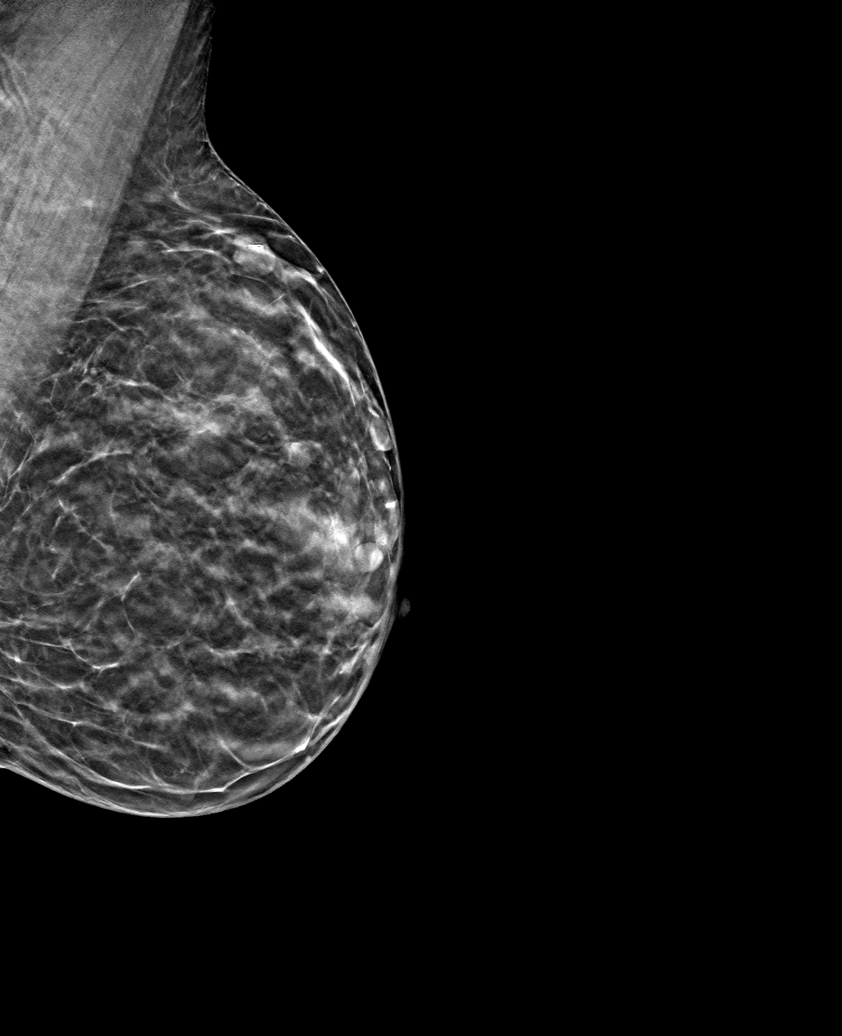

[6 of 14 positions shown; findings below may reference images not displayed]

ACR Breast Density Category c: The breast tissue is heterogeneously
dense, which may obscure small masses.
FINDINGS: Mammographically, there are no suspicious masses, areas
architectural distortion or microcalcifications in the left breast.
Post biopsy marker containing circumscribed mass in the left breast
upper outer quadrant is stable, biopsy-proven fibroadenoma.

Mammographic images were processed with CAD.
IMPRESSION: No mammographic evidence of malignancy in the left breast.

RECOMMENDATION:
Screening mammogram in one year.(Code:IQ-[id number])

I have discussed the findings and recommendations with the patient.
Results were also provided in writing at the conclusion of the
visit. If applicable, a reminder letter will be sent to the patient
regarding the next appointment.

BI-RADS CATEGORY  2: Benign.

## 2021-08-10 ENCOUNTER — Ambulatory Visit: Payer: BLUE CROSS/BLUE SHIELD | Admitting: Emergency Medicine
